# Patient Record
Sex: Male | Born: 1995 | Race: Black or African American | Hispanic: No | Marital: Single | Smoking: Current every day smoker
Health system: Southern US, Community
[De-identification: ages and names within clinical notes are randomized; demographics above are authoritative.]

## PROBLEM LIST (undated history)

## (undated) HISTORY — PX: FRACTURE SURGERY: SHX138

---
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
## 2016-08-19 ENCOUNTER — Encounter (HOSPITAL_COMMUNITY): Payer: Self-pay

## 2016-08-19 ENCOUNTER — Ambulatory Visit (HOSPITAL_COMMUNITY)
Admission: EM | Admit: 2016-08-19 | Discharge: 2016-08-19 | Disposition: A | Discharge status: Nursing Home (Includes ICF) | Payer: Medicaid Other

## 2016-08-19 ENCOUNTER — Emergency Department (HOSPITAL_COMMUNITY)
Admission: EM | Admit: 2016-08-19 | Discharge: 2016-08-20 | Disposition: A | Discharge status: Home / Self Care | Payer: Medicaid Other | Attending: Emergency Medicine | Admitting: Emergency Medicine

## 2016-08-19 ENCOUNTER — Encounter (HOSPITAL_COMMUNITY): Payer: Self-pay | Admitting: Emergency Medicine

## 2016-08-19 ENCOUNTER — Emergency Department (HOSPITAL_COMMUNITY): Payer: Medicaid Other

## 2016-08-19 DIAGNOSIS — R079 Chest pain, unspecified: Secondary | ICD-10-CM

## 2016-08-19 DIAGNOSIS — R0789 Other chest pain: Secondary | ICD-10-CM | POA: Diagnosis not present

## 2016-08-19 DIAGNOSIS — I498 Other specified cardiac arrhythmias: Secondary | ICD-10-CM

## 2016-08-19 DIAGNOSIS — I499 Cardiac arrhythmia, unspecified: Secondary | ICD-10-CM

## 2016-08-19 DIAGNOSIS — R008 Other abnormalities of heart beat: Secondary | ICD-10-CM | POA: Diagnosis not present

## 2016-08-19 LAB — CBC
HCT: 42.7 % (ref 39.0–52.0)
Hemoglobin: 14.9 g/dL (ref 13.0–17.0)
MCH: 29.1 pg (ref 26.0–34.0)
MCHC: 34.9 g/dL (ref 30.0–36.0)
MCV: 83.4 fL (ref 78.0–100.0)
PLATELETS: 229 10*3/uL (ref 150–400)
RBC: 5.12 MIL/uL (ref 4.22–5.81)
RDW: 12.3 % (ref 11.5–15.5)
WBC: 9.5 10*3/uL (ref 4.0–10.5)

## 2016-08-19 LAB — BASIC METABOLIC PANEL
Anion gap: 10 (ref 5–15)
BUN: 7 mg/dL (ref 6–20)
CHLORIDE: 97 mmol/L — AB (ref 101–111)
CO2: 24 mmol/L (ref 22–32)
CREATININE: 1.01 mg/dL (ref 0.61–1.24)
Calcium: 9.6 mg/dL (ref 8.9–10.3)
Glucose, Bld: 105 mg/dL — ABNORMAL HIGH (ref 65–99)
POTASSIUM: 3.3 mmol/L — AB (ref 3.5–5.1)
SODIUM: 131 mmol/L — AB (ref 135–145)

## 2016-08-19 LAB — I-STAT TROPONIN, ED: TROPONIN I, POC: 0 ng/mL (ref 0.00–0.08)

## 2016-08-19 MED ORDER — POTASSIUM CHLORIDE CRYS ER 20 MEQ PO TBCR
40.0000 meq | EXTENDED_RELEASE_TABLET | Freq: Once | ORAL | Status: AC
  Administered 2016-08-19: 40 meq via ORAL
  Filled 2016-08-19: qty 2

## 2016-08-19 NOTE — ED Notes (Signed)
[facility]

## 2016-08-19 NOTE — Discharge Instructions (Signed)
Follow up with local heart doctor for further evaluation of your symptoms.  If you were given medicines take as directed.  If you are on coumadin or contraceptives realize their levels and effectiveness is altered by many different medicines.  If you have any reaction (rash, tongues swelling, other) to the medicines stop taking and see a physician.    If your blood pressure was elevated in the ER make sure you follow up for management with a primary doctor or return for chest pain, shortness of breath or stroke symptoms.  Please follow up as directed and return to the ER or see a physician for new or worsening symptoms.  Thank you. Vitals:   08/19/16 2143 08/19/16 2155  BP: 153/77 138/80  Pulse: 100 101  Resp: 16 20  Temp: 98.1 F (36.7 C)   TempSrc: Oral   SpO2: 100% 100%

## 2016-08-19 NOTE — ED Notes (Signed)
Patient transported to X-ray 

## 2016-08-19 NOTE — ED Triage Notes (Signed)
[facility] a [facility] of left sided chest pain x 2 months. The patient stated that the pain feels like a pressure and tightness that on occasion radiates into his left arm. The patient stated that when he takes a deep breath it does feel better. The patient stated that he has never been diagnosed with anxiety but feels that he has it.

## 2016-08-19 NOTE — ED Triage Notes (Signed)
Pt sent to ED from [facility] for chest pain. EKG show vent bigeminy.  Onset several months left sided chest pain. Worse with exercising and better with taking deep breaths.  No other s/s noted.

## 2016-08-19 NOTE — ED Provider Notes (Signed)
CSN: [id number]     Arrival date & time 08/19/16  1912 History   First MD Initiated Contact with Patient 08/19/16 2115     Chief Complaint  Patient presents with  . Chest Pain   (Consider location/radiation/quality/duration/timing/severity/associated sxs/prior Treatment) Patient denies excessive caffeine intake, denies cocaine use. Admits to feeling anxious and have anxiety at time, also endorses smoking mariajuana.    The history is provided by the patient.  Chest Pain  Pain location:  L chest Pain quality comment:  Unable to specify Pain radiates to:  Neck Pain severity now: "earlier was really bad; the worst it ever felt" Duration: 2 months. Timing: Was intermittent but become more constant. Chronicity:  New Relieved by:  Nothing Exacerbated by: exercising would sometimes aggrevate it. Ineffective treatments:  Certain positions (certain positions sometimes help) Associated symptoms: anxiety, back pain and palpitations   Associated symptoms: no cough, no diaphoresis, no dizziness, no fatigue, no fever, no headache, no nausea, no shortness of breath, no syncope and no weakness     History reviewed. No pertinent past medical history. Past Surgical History:  Procedure Laterality Date  . FRACTURE SURGERY     Family History  Problem Relation Age of Onset  . Hypertension Father   . Diabetes Paternal Grandmother   . Hypertension Paternal Grandmother    Social History  Substance Use Topics  . Smoking status: Never Smoker  . Smokeless tobacco: Never Used  . Alcohol use No    Review of Systems  Constitutional: Negative for chills, diaphoresis, fatigue and fever.  Respiratory: Negative for cough and shortness of breath.   Cardiovascular: Positive for chest pain and palpitations. Negative for syncope.  Gastrointestinal: Negative for nausea.  Musculoskeletal: Positive for back pain.  Neurological: Negative for dizziness, weakness and headaches.  Psychiatric/Behavioral:   Positive for anxiety    Allergies  Review of patient's allergies indicates no known allergies.  Home Medications   Prior to Admission medications   Medication Sig Start Date End Date Taking? Authorizing Provider  ibuprofen (ADVIL,MOTRIN) 800 MG tablet Take 800 mg by mouth every 8 (eight) hours as needed.   Yes Historical Provider, MD   Meds Ordered and Administered this Visit  Medications - No data to display  BP 122/80 (BP Location: Left Arm)   Pulse 71   Temp 98.2 F (36.8 C) (Oral)   Resp 16   Ht 5\' 10"  (1.778 m)   Wt 153 lb (69.4 kg)   SpO2 97%   BMI 21.95 kg/m  No data found.   Physical Exam  Constitutional: He is oriented to person, place, and time. He appears well-developed and well-nourished. No distress.  HENT:  Head: Normocephalic and atraumatic.  Eyes: Pupils are equal, round, and reactive to light.  Neck: Normal range of motion. Neck supple.  Cardiovascular: Normal rate, regular rhythm and normal heart sounds.   No murmur heard. Pulmonary/Chest: Effort normal and breath sounds normal. He has no wheezes.  Abdominal: Soft. Bowel sounds are normal. There is no tenderness.  Musculoskeletal: Normal range of motion. He exhibits no edema.  Neurological: He is alert and oriented to person, place, and time. Coordination normal.  Skin: Skin is warm and dry. No rash noted. No pallor.  Psychiatric: He has a normal mood and affect. His behavior is normal.  Nursing note and vitals reviewed.   Urgent Care Course   Clinical Course    Procedures (including critical care time)  Labs Review Labs Reviewed - No data to display  Imaging Review No results found.      MDM   1. Ventricular bigeminy   2. Chest pain, unspecified chest pain type     Physical examination was normal with no red flags noted. EKG has bigeminy. Bigeminy could be due to his anxiety but other causes cannot be rule out. Patient requires further workup that we do not offer here. Despite his  normal physical exam,  I do not feel comfortable sending patient home tonight given his abnormal EKG. Patient transferred to ER via his own-vehicle for further evaluation. Patient is currently stable, vital sign appropriate, he is safe to transport via own vehicle. All questions were answered.    Lucia EstelleFeng Shreyansh Tiffany, NP 08/19/16 2231

## 2016-08-19 NOTE — ED Provider Notes (Signed)
[facility]ENCY DEPT Provider Note   CSN: [id number] Arrival date & time: 08/19/16  2133     History   Chief Complaint Chief Complaint  Patient presents with  . Chest Pain    HPI Jose Waters is a 20 y.o. male.  Patient presents with recurrent chest discomfort. Nonspecific ache lasting various amounts of time for the past 2 months. No significant family or personal history of cardiac issues. Patient exercises without difficulty has never passed out. No focal radiation. No recent surgery no leg swelling. Patient feels well otherwise      History reviewed. No pertinent past medical history.  There are no active problems to display for this patient.   Past Surgical History:  Procedure Laterality Date  . FRACTURE SURGERY         Home Medications    Prior to Admission medications   Medication Sig Start Date End Date Taking? Authorizing Provider  ibuprofen (ADVIL,MOTRIN) 800 MG tablet Take 800 mg by mouth every 8 (eight) hours as needed (for back pain).    Yes Historical Provider, MD    Family History Family History  Problem Relation Age of Onset  . Hypertension Father   . Diabetes Paternal Grandmother   . Hypertension Paternal Grandmother     Social History Social History  Substance Use Topics  . Smoking status: Never Smoker  . Smokeless tobacco: Never Used  . Alcohol use No     Allergies   Review of patient's allergies indicates no known allergies.   Review of Systems Review of Systems  Constitutional: Negative for chills and fever.  HENT: Negative for congestion.   Eyes: Negative for visual disturbance.  Respiratory: Negative for shortness of breath.   Cardiovascular: Positive for chest pain.  Gastrointestinal: Negative for abdominal pain and vomiting.  Genitourinary: Negative for dysuria and flank pain.  Musculoskeletal: Negative for back pain, neck pain and neck stiffness.  Skin: Negative for rash.  Neurological: Negative for syncope,  light-headedness and headaches.     Physical Exam Updated Vital Signs BP 138/80   Pulse 101   Temp 98.1 F (36.7 C) (Oral)   Resp 20   SpO2 100%   Physical Exam  Constitutional: He is oriented to person, place, and time. He appears well-developed and well-nourished.  HENT:  Head: Normocephalic and atraumatic.  Eyes: Conjunctivae are normal. Right eye exhibits no discharge. Left eye exhibits no discharge.  Neck: Normal range of motion. Neck supple. No tracheal deviation present.  Cardiovascular: Normal rate, regular rhythm, normal heart sounds and intact distal pulses.   No murmur heard. Pulmonary/Chest: Effort normal and breath sounds normal.  Abdominal: Soft. He exhibits no distension. There is no tenderness. There is no guarding.  Musculoskeletal: He exhibits no edema.  Neurological: He is alert and oriented to person, place, and time.  Skin: Skin is warm. No rash noted.  Psychiatric: He has a normal mood and affect.  Nursing note and vitals reviewed.    ED Treatments / Results  Labs (all labs ordered are listed, but only abnormal results are displayed) Labs Reviewed  BASIC METABOLIC PANEL - Abnormal; Notable for the following:       Result Value   Sodium 131 (*)    Potassium 3.3 (*)    Chloride 97 (*)    Glucose, Bld 105 (*)    All other components within normal limits  CBC  I-STAT TROPOININ, ED    EKG  EKG Interpretation None      EKG reviewed  heart rate 103, sinus tachycardia with frequent PVCs/bigeminy, minimally prolonged QT Radiology No results found.  Procedures Procedures (including critical care time)  Medications Ordered in ED Medications  potassium chloride SA (K-DUR,KLOR-CON) CR tablet 40 mEq (40 mEq Oral Given 08/19/16 2310)     Initial Impression / Assessment and Plan / ED Course  I have reviewed the triage vital signs and the nursing notes.  Pertinent labs & imaging results that were available during my care of the patient were  reviewed by me and considered in my medical decision making (see chart for details).  Clinical Course   Well-appearing patient presents with intermittent chest pain 2 months. Patient is bigeminy and frequent PVCs in the ER. No syncope or red flags on history. Discussed the case with cardiology fellow who recommended outpatient follow up in the clinic in the next week. Discussed with patient who agrees with the plan  Results and differential diagnosis were discussed with the patient/parent/guardian. Xrays were independently reviewed by myself.  Close follow up outpatient was discussed, comfortable with the plan.   Medications  potassium chloride SA (K-DUR,KLOR-CON) CR tablet 40 mEq (40 mEq Oral Given 08/19/16 2310)    Vitals:   08/19/16 2143 08/19/16 2155 08/19/16 2300 08/19/16 2349  BP: 153/77 138/80 124/79 136/98  Pulse: 100 101 93 80  Resp: 16 20 16 23   Temp: 98.1 F (36.7 C)     TempSrc: Oral     SpO2: 100% 100% 99% 98%    Final diagnoses:  Bigeminy  Chest pain, unspecified     Final Clinical Impressions(s) / ED Diagnoses   Final diagnoses:  Bigeminy  Chest pain, unspecified    New Prescriptions New Prescriptions   No medications on file     Blane OharaJoshua Jakya Dovidio, MD 08/19/16 2357

## 2016-08-20 NOTE — ED Notes (Signed)
Pt understood dc material. NAD noted. 

## 2016-08-23 ENCOUNTER — Emergency Department (HOSPITAL_COMMUNITY)
Admission: EM | Admit: 2016-08-23 | Discharge: 2016-08-24 | Disposition: A | Discharge status: Home / Self Care | Payer: Medicaid Other | Attending: Emergency Medicine | Admitting: Emergency Medicine

## 2016-08-23 ENCOUNTER — Encounter (HOSPITAL_COMMUNITY): Payer: Self-pay

## 2016-08-23 DIAGNOSIS — R1031 Right lower quadrant pain: Secondary | ICD-10-CM

## 2016-08-23 DIAGNOSIS — Z791 Long term (current) use of non-steroidal anti-inflammatories (NSAID): Secondary | ICD-10-CM | POA: Insufficient documentation

## 2016-08-23 LAB — COMPREHENSIVE METABOLIC PANEL
ALBUMIN: 5 g/dL (ref 3.5–5.0)
ALT: 16 U/L — ABNORMAL LOW (ref 17–63)
ANION GAP: 7 (ref 5–15)
AST: 20 U/L (ref 15–41)
Alkaline Phosphatase: 85 U/L (ref 38–126)
BILIRUBIN TOTAL: 0.9 mg/dL (ref 0.3–1.2)
BUN: 20 mg/dL (ref 6–20)
CO2: 29 mmol/L (ref 22–32)
Calcium: 9.4 mg/dL (ref 8.9–10.3)
Chloride: 104 mmol/L (ref 101–111)
Creatinine, Ser: 0.98 mg/dL (ref 0.61–1.24)
GFR calc Af Amer: >60 mL/min (ref >60)
GFR calc non Af Amer: >60 mL/min (ref >60)
GLUCOSE: 113 mg/dL — AB (ref 65–99)
POTASSIUM: 3.6 mmol/L (ref 3.5–5.1)
SODIUM: 140 mmol/L (ref 135–145)
TOTAL PROTEIN: 7.3 g/dL (ref 6.5–8.1)

## 2016-08-23 LAB — CBC
HEMATOCRIT: 38.4 % — AB (ref 39.0–52.0)
HEMOGLOBIN: 13.6 g/dL (ref 13.0–17.0)
MCH: 28.9 pg (ref 26.0–34.0)
MCHC: 35.4 g/dL (ref 30.0–36.0)
MCV: 81.5 fL (ref 78.0–100.0)
Platelets: 233 10*3/uL (ref 150–400)
RBC: 4.71 MIL/uL (ref 4.22–5.81)
RDW: 12.5 % (ref 11.5–15.5)
WBC: 6.1 10*3/uL (ref 4.0–10.5)

## 2016-08-23 LAB — LIPASE, BLOOD: Lipase: 25 U/L (ref 11–51)

## 2016-08-23 MED ORDER — MORPHINE SULFATE (PF) 4 MG/ML IV SOLN
4.0000 mg | Freq: Once | INTRAVENOUS | Status: AC
  Administered 2016-08-24: 4 mg via INTRAVENOUS
  Filled 2016-08-23: qty 1

## 2016-08-23 MED ORDER — ONDANSETRON HCL 4 MG/2ML IJ SOLN
4.0000 mg | Freq: Once | INTRAMUSCULAR | Status: AC
  Administered 2016-08-24: 4 mg via INTRAVENOUS
  Filled 2016-08-23: qty 2

## 2016-08-23 MED ORDER — SODIUM CHLORIDE 0.9 % IV BOLUS (SEPSIS)
1000.0000 mL | Freq: Once | INTRAVENOUS | Status: AC
  Administered 2016-08-23: 1000 mL via INTRAVENOUS

## 2016-08-23 NOTE — ED Provider Notes (Signed)
[facility]EMERGENCY DEPT Provider Note   CSN: [id number] Arrival date & time: 08/23/16  2158  By signing my name below, I, Jose Waters, attest that this documentation has been prepared under the direction and in the presence of non-physician practitioner, Arthor CaptainAbigail Arien Benincasa, PA-C. Electronically Signed: Majel HomerPeyton Waters, Scribe. 08/23/2016. 11:37 PM.  History   Chief Complaint Chief Complaint  Patient presents with  . Abdominal Pain    RLQ   The history is provided by the patient. No language interpreter was used.   HPI Comments: Jose Waters is a 20 y.o. male who presents to the Emergency Department complaining of gradually worsening, constant, "tight," 7/10, RLQ abdominal pain that began 3 days ago and worsened today. Pt reports his pain is exacerbated after eating and when exhaling deeply. He notes this is the first time he has experienced similar symptoms. He notes his last meal was at ~6:00 PM this evening. Pt reports his last BM was yesterday. He denies nausea, vomiting, diarrhea, difficulty urinating,  frequency, urgency, hematuria and PSHx ot his abdomen.   [facility] ED [facility]/27 and diagnosed with ventricular bigeminy.   History reviewed. No pertinent past medical history.  There are no active problems to display for this patient.  Past Surgical History:  Procedure Laterality Date  . FRACTURE SURGERY      Home Medications    Prior to Admission medications   Medication Sig Start Date End Date Taking? Authorizing Provider  ibuprofen (ADVIL,MOTRIN) 800 MG tablet Take 800 mg by mouth every 8 (eight) hours as needed (for back pain).    Yes Historical Provider, MD  naproxen (NAPROSYN) 500 MG tablet Take 500 mg by mouth 2 (two) times daily with a meal.   Yes Historical Provider, MD   Family History Family History  Problem Relation Age of Onset  . Hypertension Father   . Diabetes Paternal Grandmother   . Hypertension Paternal Grandmother     Social History Social History    Substance Use Topics  . Smoking status: Never Smoker  . Smokeless tobacco: Never Used  . Alcohol use No   Allergies   Review of patient's allergies indicates no known allergies.   Review of Systems Review of Systems 10 systems reviewed and all are negative for acute change except as noted in the HPI.  Physical Exam Updated Vital Signs BP 153/86 (BP Location: Right Arm)   Pulse 87   Resp 18   SpO2 100%   Physical Exam  Constitutional: He is oriented to person, place, and time. He appears well-developed and well-nourished.  HENT:  Head: Normocephalic and atraumatic.  Eyes: EOM are normal.  Neck: Normal range of motion.  Cardiovascular: Normal rate, regular rhythm, normal heart sounds and intact distal pulses.   Pulmonary/Chest: Effort normal and breath sounds normal. No respiratory distress.  Abdominal: Soft. He exhibits no distension. There is tenderness.  Positive at McBurney's point   Musculoskeletal: Normal range of motion.  Neurological: He is alert and oriented to person, place, and time.  Skin: Skin is warm and dry.  Psychiatric: He has a normal mood and affect. Judgment normal.  Nursing note and vitals reviewed.  ED Treatments / Results  Labs (all labs ordered are listed, but only abnormal results are displayed) Labs Reviewed  LIPASE, BLOOD  COMPREHENSIVE METABOLIC PANEL  CBC  URINALYSIS, ROUTINE W REFLEX MICROSCOPIC (NOT AT [facility])    EKG  EKG Interpretation None       Radiology No results found.  Procedures Procedures  DIAGNOSTIC STUDIES:  Oxygen Saturation is 98% on RA, normal by my interpretation.    COORDINATION OF CARE:  11:35 PM Discussed treatment plan with pt at bedside and pt agreed to plan.  Medications Ordered in ED Medications - No data to display   Initial Impression / Assessment and Plan / ED Course  I have reviewed the triage vital signs and the nursing notes.  Pertinent labs & imaging results that were available during my  care of the patient were reviewed by me and considered in my medical decision making (see chart for details).  Clinical Course    Patient is nontoxic, nonseptic appearing, in no apparent distress.  Patient's pain and other symptoms adequately managed in emergency department.  Fluid bolus given.  Labs, imaging and vitals reviewed.  Patient does not meet the SIRS or Sepsis criteria.  On repeat exam patient does not have a surgical abdomin and there are no peritoneal signs.  No indication of appendicitis, bowel obstruction, bowel perforation, cholecystitis, diverticulitis.  Patient discharged home with symptomatic treatment and given strict instructions for follow-up with their primary care physician.  I have also discussed reasons to return immediately to the ER.  Patient expresses understanding and agrees with plan.     I personally performed the services described in this documentation, which was scribed in my presence. The recorded information has been reviewed and is accurate.   Final Clinical Impressions(s) / ED Diagnoses   Final diagnoses:  None    New Prescriptions New Prescriptions   No medications on file     Arthor Captain, PA-C 08/24/16 0118    Paula Libra, MD 08/24/16 1610

## 2016-08-23 NOTE — ED Triage Notes (Signed)
Pt presents with RLQ pain for the last 3 days, but it got worse today. Pt is tearful in triage. Denies N/V/D. Denies rebound tenderness. A&Ox4. Ambulatory.

## 2016-08-24 ENCOUNTER — Encounter (HOSPITAL_COMMUNITY): Payer: Self-pay

## 2016-08-24 ENCOUNTER — Emergency Department (HOSPITAL_COMMUNITY): Payer: Medicaid Other

## 2016-08-24 LAB — RPR: RPR Ser Ql: NONREACTIVE

## 2016-08-24 LAB — HIV ANTIBODY (ROUTINE TESTING W REFLEX): HIV SCREEN 4TH GENERATION: NONREACTIVE

## 2016-08-24 MED ORDER — DICYCLOMINE HCL 20 MG PO TABS: 20.0000 mg | ORAL_TABLET | Freq: Two times a day (BID) | ORAL | 0 refills | Status: DC

## 2016-08-24 MED ORDER — NAPROXEN 375 MG PO TABS: 375.0000 mg | ORAL_TABLET | Freq: Two times a day (BID) | ORAL | 0 refills | Status: DC

## 2016-08-24 MED ORDER — IOPAMIDOL (ISOVUE-300) INJECTION 61%
100.0000 mL | Freq: Once | INTRAVENOUS | Status: AC | PRN
  Administered 2016-08-24: 100 mL via INTRAVENOUS

## 2016-08-24 NOTE — Discharge Instructions (Signed)
Your CT scan was negative and your EKG showed no abnormalities. There is no evidence of infection in her abdomen. No evidence of kidney stones or other abnormalities. Unsure of the reason for abdominal pain. Sometimes gas pain or constipation can cause severe pain.

## 2016-08-31 ENCOUNTER — Encounter (HOSPITAL_COMMUNITY): Payer: Self-pay

## 2016-08-31 ENCOUNTER — Emergency Department (HOSPITAL_COMMUNITY)
Admission: EM | Admit: 2016-08-31 | Discharge: 2016-09-01 | Disposition: A | Discharge status: Home / Self Care | Payer: Medicaid Other | Attending: Emergency Medicine | Admitting: Emergency Medicine

## 2016-08-31 DIAGNOSIS — I493 Ventricular premature depolarization: Secondary | ICD-10-CM | POA: Diagnosis not present

## 2016-08-31 DIAGNOSIS — R251 Tremor, unspecified: Secondary | ICD-10-CM | POA: Diagnosis not present

## 2016-08-31 DIAGNOSIS — R0789 Other chest pain: Secondary | ICD-10-CM | POA: Diagnosis present

## 2016-08-31 DIAGNOSIS — I498 Other specified cardiac arrhythmias: Secondary | ICD-10-CM

## 2016-08-31 DIAGNOSIS — I499 Cardiac arrhythmia, unspecified: Secondary | ICD-10-CM

## 2016-08-31 NOTE — ED Provider Notes (Signed)
[facility] Provider Note   CSN: [id number] Arrival date & time: 08/31/16  2309 By signing my name below, I, Bridgette Habermann, attest that this documentation has been prepared under the direction and in the presence of Gilda Crease, MD. Electronically Signed: Bridgette Habermann, ED Scribe. 08/31/16. 11:48 PM.  History   Chief Complaint Chief Complaint  Patient presents with  . Tachycardia   HPI Comments: Jose Waters is a 20 y.o. male who presents to the Emergency Department complaining of constant chest tightness onset just PTA. Pt states he also has mild shortness of breath, heart palpitations, and tremors. Denies modifying factors. Pt states his chest tightness and palpitations are resolved at this time but notes that tremors are still present in his bilateral lower extremities. Pt denies chest pain, fever, or any other associated symptoms.  The history is provided by the patient. No language interpreter was used.    History reviewed. No pertinent past medical history.  There are no active problems to display for this patient.   Past Surgical History:  Procedure Laterality Date  . FRACTURE SURGERY         Home Medications    Prior to Admission medications   Medication Sig Start Date End Date Taking? Authorizing Provider  dicyclomine (BENTYL) 20 MG tablet Take 1 tablet (20 mg total) by mouth 2 (two) times daily. 08/24/16  Yes Arthor Captain, PA-C  naproxen (NAPROSYN) 375 MG tablet Take 1 tablet (375 mg total) by mouth 2 (two) times daily. Patient not taking: Reported on 09/01/2016 08/24/16   Arthor Captain, PA-C    Family History Family History  Problem Relation Age of Onset  . Hypertension Father   . Diabetes Paternal Grandmother   . Hypertension Paternal Grandmother     Social History Social History  Substance Use Topics  . Smoking status: Never Smoker  . Smokeless tobacco: Never Used  . Alcohol use No     Allergies   Review of patient's allergies indicates  no known allergies.   Review of Systems Review of Systems  Constitutional: Negative for fever.  Respiratory: Positive for chest tightness and shortness of breath.   Cardiovascular: Positive for palpitations. Negative for chest pain.  Neurological: Positive for tremors.  All other systems reviewed and are negative.    Physical Exam Updated Vital Signs BP 131/77 (BP Location: Left Arm)   Pulse 63   Temp 98.8 F (37.1 C) (Oral)   Resp 21   SpO2 99%   Physical Exam  Constitutional: He is oriented to person, place, and time. He appears well-developed and well-nourished. No distress.  Very anxious appearing.  HENT:  Head: Normocephalic and atraumatic.  Right Ear: Hearing normal.  Left Ear: Hearing normal.  Nose: Nose normal.  Mouth/Throat: Oropharynx is clear and moist and mucous membranes are normal.  Eyes: Conjunctivae and EOM are normal. Pupils are equal, round, and reactive to light.  Neck: Normal range of motion. Neck supple.  Cardiovascular: Regular rhythm, S1 normal and S2 normal.  Exam reveals no gallop and no friction rub.   No murmur heard. Occasional extra systole.  Pulmonary/Chest: Effort normal and breath sounds normal. No respiratory distress. He exhibits no tenderness.  Abdominal: Soft. Normal appearance and bowel sounds are normal. There is no hepatosplenomegaly. There is no tenderness. There is no rebound, no guarding, no tenderness at McBurney's point and negative Murphy's sign. No hernia.  Musculoskeletal: Normal range of motion.  Neurological: He is alert and oriented to person, place, and time.  He has normal strength. No cranial nerve deficit or sensory deficit. Coordination normal. GCS eye subscore is 4. GCS verbal subscore is 5. GCS motor subscore is 6.  Skin: Skin is warm, dry and intact. No rash noted. No cyanosis.  Psychiatric: He has a normal mood and affect. His speech is normal and behavior is normal. Thought content normal.  Nursing note and vitals  reviewed.    ED Treatments / Results  DIAGNOSTIC STUDIES: Oxygen Saturation is 100% on RA, normal by my interpretation.    COORDINATION OF CARE: 11:46 PM Discussed treatment plan with pt at bedside which includes cardiac monitoring and pt agreed to plan.  Labs (all labs ordered are listed, but only abnormal results are displayed) Labs Reviewed  I-STAT CHEM 8, ED - Abnormal; Notable for the following:       Result Value   Chloride 98 (*)    All other components within normal limits    EKG  EKG Interpretation  Date/Time:  Friday August 31 2016 23:18:06 EDT Ventricular Rate:  97 PR Interval:    QRS Duration: 103 QT Interval:  371 QTC Calculation: 472 R Axis:   81 Text Interpretation:  Sinus tachycardia Ventricular bigeminy RAE, consider biatrial enlargement Borderline T wave abnormalities Borderline prolonged QT interval No significant change since last tracing Confirmed by Christe Tellez  MD, Codi Kertz [phone]([id number]) on 08/31/2016 11:39:37 PM       Radiology No results found.  Procedures Procedures (including critical care time)  Medications Ordered in ED Medications - No data to display   Initial Impression / Assessment and Plan / ED Course  I have reviewed the triage vital signs and the nursing notes.  Pertinent labs & imaging results that were available during my care of the patient were reviewed by me and considered in my medical decision making (see chart for details).  Clinical Course    Patient presents to the emergency department for evaluation ofpalpitations. Patient has been seen in the ER with this previously. At each visit he has been documented to have PVCs and occasional ventricular bigeminy, but no othermalignant arrhythmia. Patient has once again been seen to have ventricular bigeminy here in the ER, has been monitored for a period of time without any other arrhythmia. He did have mild hypokalemia at previous visit, normal today. Patient is extremely anxious.He  was reassured, and follow-up with cardiology in the office for outpatient monitoring. Will start low dose lopressor.  Final Clinical Impressions(s) / ED Diagnoses   Final diagnoses:  Ventricular bigeminy    New Prescriptions New Prescriptions   No medications on file  I personally performed the services described in this documentation, which was scribed in my presence. The recorded information has been reviewed and is accurate.      Gilda Creasehristopher J Aileana Hodder, MD 09/01/16 [phone]

## 2016-08-31 NOTE — ED Triage Notes (Signed)
Patient c/o chest tightness and heart beating fast.  Patient denies N/V, states has "a little" SOB.  Patient states that nothing makes is worse or better.  Patient breathing even and unlabored.  Denies chest pain at this time.

## 2016-09-01 LAB — I-STAT CHEM 8, ED
BUN: 12 mg/dL (ref 6–20)
CALCIUM ION: 1.2 mmol/L (ref 1.15–1.40)
CHLORIDE: 98 mmol/L — AB (ref 101–111)
Creatinine, Ser: 0.9 mg/dL (ref 0.61–1.24)
GLUCOSE: 83 mg/dL (ref 65–99)
HCT: 42 % (ref 39.0–52.0)
HEMOGLOBIN: 14.3 g/dL (ref 13.0–17.0)
POTASSIUM: 3.6 mmol/L (ref 3.5–5.1)
SODIUM: 139 mmol/L (ref 135–145)
TCO2: 28 mmol/L (ref 0–100)

## 2016-09-01 MED ORDER — METOPROLOL TARTRATE 25 MG PO TABS: 12.5000 mg | ORAL_TABLET | Freq: Two times a day (BID) | ORAL | 0 refills | Status: DC

## 2016-09-05 ENCOUNTER — Ambulatory Visit (INDEPENDENT_AMBULATORY_CARE_PROVIDER_SITE_OTHER): Payer: Medicaid Other | Admitting: Internal Medicine

## 2016-09-05 ENCOUNTER — Encounter: Payer: Self-pay | Admitting: Internal Medicine

## 2016-09-05 VITALS — BP 124/78 | HR 84 | Ht 69.0 in | Wt 146.1 lb

## 2016-09-05 DIAGNOSIS — I493 Ventricular premature depolarization: Secondary | ICD-10-CM | POA: Diagnosis not present

## 2016-09-05 DIAGNOSIS — R079 Chest pain, unspecified: Secondary | ICD-10-CM | POA: Diagnosis not present

## 2016-09-05 MED ORDER — IBUPROFEN 200 MG PO TABS: 400.0000 mg | ORAL_TABLET | Freq: Four times a day (QID) | ORAL | 0 refills | Status: DC | PRN

## 2016-09-05 MED ORDER — RANITIDINE HCL 150 MG PO TABS: 150.0000 mg | ORAL_TABLET | Freq: Two times a day (BID) | ORAL | Status: DC

## 2016-09-05 NOTE — Progress Notes (Signed)
New Outpatient Visit Date: 09/05/2016  Referring Provider: Blane Ohara, MD [address] [place], [place] [address]  Chief Complaint:  Chief Complaint  Patient presents with  . Chest Pain  . Palpitations    HPI:  Jose Waters is a 20 y.o. year-old male with no significant past medical, who has been referred by Dr. Jodi Mourning in the [facility] ED for evaluation of chest pain and palpitations.  The patient reports intermittent chest pain described as pressure and tightness in the center of the chest for 1 month.  The pain is not exertional or affected by deep inspiration but seems to worsen when lying or sitting in certain positions.  He also notes occasional sharp pain under his left breast which moves up to his collar bone.  The pain typically lasts ~30 minutes before resolving on its own.  On average, the patient experiences the pain every other day.  At it's worst, the pain in 7-8/10.  He experienced 2/10 pain upon arrival in clinic today.  The patient endorses mild accompanying shortness of breath but denies nausea, vomiting, diaphoresis, and dizziness.  He believes the symptoms began about 1-2 months after he began smoking marijuana.  He has not smoked since the symptoms began.  He has used naproxen intermittently with resolution of the pain.  However, he stopped using this due to GI upset.  The patient denies chest wall trauma or tenderness to palpation.  The patient also notes occasional palpitations described as racing and skipped beats in his chest.  During his most recent ED visit, he was noted to have frequent PVC's and was started on metoprolol.  He believes the chest pain and palpitations have improved somewhat since starting this medication.  He rarely consumes energy drinks and has not had one since his chest pain and palpitations began.  --------------------------------------------------------------------------------------------------  Past Medical History: None  Past Surgical History:   Procedure Laterality Date  . FRACTURE SURGERY     Right knuckle surgery    Outpatient Encounter Prescriptions as of 09/05/2016  Medication Sig  . dicyclomine (BENTYL) 20 MG tablet Take 1 tablet (20 mg total) by mouth 2 (two) times daily.  . metoprolol tartrate (LOPRESSOR) 25 MG tablet Take 0.5 tablets (12.5 mg total) by mouth 2 (two) times daily.  . polyethylene glycol powder (GLYCOLAX/MIRALAX) powder Take 1 g by mouth daily.  Marland Kitchen ibuprofen (ADVIL,MOTRIN) 200 MG tablet Take 2 tablets (400 mg total) by mouth every 6 (six) hours as needed.  . ranitidine (ZANTAC) 150 MG tablet Take 1 tablet (150 mg total) by mouth 2 (two) times daily.  . [DISCONTINUED] naproxen (NAPROSYN) 375 MG tablet Take 1 tablet (375 mg total) by mouth 2 (two) times daily. (Patient not taking: Reported on 09/01/2016)   No facility-administered encounter medications on file as of 09/05/2016.     Allergies: Review of patient's allergies indicates no known allergies.  Social History   Social History  . Marital status: Single    Spouse name: N/A  . Number of children: N/A  . Years of education: N/A   Occupational History  . Not on file.   Social History Main Topics  . Smoking status: Never Smoker  . Smokeless tobacco: Never Used  . Alcohol use No  . Drug use:     Types: Marijuana  . Sexual activity: Not on file   Other Topics Concern  . Not on file   Social History Narrative  . No narrative on file    Family History  Problem Relation Age of Onset  . Hypertension Father   . Diabetes Paternal Grandmother   . Hypertension Paternal Grandmother   . Unexplained death Neg Hx   . Sudden Cardiac Death Neg Hx     Review of Systems: A 12-system review of systems was performed and was negative except as noted in the HPI.  --------------------------------------------------------------------------------------------------  Physical Exam: BP 124/78   Pulse 84   Ht 5\' 9"  (1.753 m)   Wt 146 lb 1.9 oz (66.3 kg)    SpO2 96%   BMI 21.58 kg/m   General:  Well-developed, well nourished young man, seated comfortably in the exam room.  He is accompanied by his girlfriend. HEENT: No conjunctival pallor or scleral icterus.  Moist mucous membranes.  OP clear. Neck: Supple without lymphadenopathy, thyromegaly, JVD, or HJR. Lungs: Normal work of breathing.  Clear to auscultation bilaterally without wheezes or crackles. CV: Regular rate and rhythm without murmurs, rubs, or gallops.  Non-displaced PMI.  Mild sternal tenderness to palpation. Abd: Bowel sounds present.  Soft, NT/ND without hepatosplenomegaly Ext: No lower extremity edema.  Radial, PT, and DP pulses are 2+ bilaterally Skin: warm and dry without rash Neuro: CNIII-XII intact.  Strength and fine-touch sensation intact in upper and lower extremities bilaterally. Psych: Normal mood and affect.  EKG:  Normal sinus rhythm without significant abnormalities.  Previously noted PVC's are no longer evident.  Lab Results  Component Value Date   WBC 6.1 08/23/2016   HGB 14.3 09/01/2016   HCT 42.0 09/01/2016   MCV 81.5 08/23/2016   PLT 233 08/23/2016    Lab Results  Component Value Date   NA 139 09/01/2016   K 3.6 09/01/2016   CL 98 (L) 09/01/2016   CO2 29 08/23/2016   BUN 12 09/01/2016   CREATININE 0.90 09/01/2016   GLUCOSE 83 09/01/2016   ALT 16 (L) 08/23/2016    --------------------------------------------------------------------------------------------------  ASSESSMENT AND PLAN: 1. Chest pain, unspecified chest pain type The patient has a 1 month history of atypical chest pain.  Most likely etiology is musculoskeletal, given improvement with NSAIDs.  EKG and physical exam are not suggestive of pericarditis.  Atherosclerotic CAD is very unlikely given his young age; myocardial ischemia would be a consideration if an anomalous coronary artery is present.  GI or pulmonary processes are also possible, particularly since symptoms began shortly  after the patient began smoking marijuana.  We will give with an echocardiogram to evaluate for structural abnormalities (especially in light of PVC's, as described below) and an exercise treadmill stress test.  I have also recommended that Jose Waters begin taking ranitidine 150 mg BID and ibuprofen 400 mg as needed for pain.  2. PVC's (premature ventricular contractions) Frequent PVC's noted during recent ED visit.  Symptomatically, palpitations have improved with metoprolol.  EKG today is without arrhtyhmia.  Will obtain TTE and ETT and continue with metoprolol.  Follow-up: Return to clinic in 3 months.  Yvonne Kendallhristopher Jameriah Trotti, MD 09/05/2016 8:57 PM

## 2016-09-05 NOTE — Patient Instructions (Signed)
Your physician has recommended you make the following change in your medication:  1.) start ranitidine (Zantac) 150 mg 1 tablet two times a day 2.) start ibuprofen 400 mg every 6 hours as needed for pain  Your physician has requested that you have an echocardiogram. Echocardiography is a painless test that uses sound waves to create images of your heart. It provides your doctor with information about the size and shape of your heart and how well your heart's chambers and valves are working. This procedure takes approximately one hour. There are no restrictions for this procedure.  Your physician has requested that you have an exercise tolerance test. For further information please visit [email or web address]. Please also follow instruction sheet, as given.  Your physician recommends that you schedule a follow-up appointment in: 3 months with Dr. Okey DupreEnd.

## 2016-09-06 NOTE — Addendum Note (Signed)
Addended by: Chana BodeGREEN, Dayvon Dax L on: 09/06/2016 04:22 PM   Modules accepted: Orders

## 2016-09-18 ENCOUNTER — Other Ambulatory Visit (HOSPITAL_COMMUNITY): Payer: Medicaid Other

## 2016-09-27 ENCOUNTER — Other Ambulatory Visit: Payer: Self-pay

## 2016-09-27 ENCOUNTER — Ambulatory Visit (INDEPENDENT_AMBULATORY_CARE_PROVIDER_SITE_OTHER): Payer: Medicaid Other

## 2016-09-27 ENCOUNTER — Ambulatory Visit (HOSPITAL_COMMUNITY): Payer: Medicaid Other | Attending: Cardiology

## 2016-09-27 DIAGNOSIS — I493 Ventricular premature depolarization: Secondary | ICD-10-CM | POA: Diagnosis not present

## 2016-09-27 DIAGNOSIS — R079 Chest pain, unspecified: Secondary | ICD-10-CM

## 2016-09-27 LAB — EXERCISE TOLERANCE TEST
CHL CUP MPHR: 201 {beats}/min
CHL RATE OF PERCEIVED EXERTION: 15
CSEPED: 12 min
CSEPHR: 94 %
Estimated workload: 13.4 METS
Exercise duration (sec): 0 s
Peak HR: 190 {beats}/min
Rest HR: 86 {beats}/min

## 2016-12-06 ENCOUNTER — Ambulatory Visit: Payer: Medicaid Other | Admitting: Internal Medicine

## 2016-12-06 NOTE — Progress Notes (Deleted)
Follow-up Outpatient Visit Date: 12/06/2016  Chief Complaint: ***  HPI:  Mr. Jose Waters is a 20 y.o. year-old male with history of ***, who presents for follow-up of ***.  --------------------------------------------------------------------------------------------------  Cardiovascular History & Procedures: Cardiovascular Problems:  ***  Risk Factors:  ***  Cath/PCI:  ***  CV Surgery:  ***  EP Procedures and Devices:  ***  Non-Invasive Evaluation(s):  ***  Recent CV Pertinent Labs: Lab Results  Component Value Date   K 3.6 09/01/2016   BUN 12 09/01/2016   CREATININE 0.90 09/01/2016     Past medical and surgical history were reviewed and updated in EPIC.   Outpatient Encounter Prescriptions as of 12/06/2016  Medication Sig  . dicyclomine (BENTYL) 20 MG tablet Take 1 tablet (20 mg total) by mouth 2 (two) times daily.  Marland Kitchen. ibuprofen (ADVIL,MOTRIN) 200 MG tablet Take 2 tablets (400 mg total) by mouth every 6 (six) hours as needed.  . metoprolol tartrate (LOPRESSOR) 25 MG tablet Take 0.5 tablets (12.5 mg total) by mouth 2 (two) times daily.  . polyethylene glycol powder (GLYCOLAX/MIRALAX) powder Take 1 g by mouth daily.  . ranitidine (ZANTAC) 150 MG tablet Take 1 tablet (150 mg total) by mouth 2 (two) times daily.   No facility-administered encounter medications on file as of 12/06/2016.     Allergies: Patient has no known allergies.  Social History   Social History  . Marital status: Single    Spouse name: N/A  . Number of children: N/A  . Years of education: N/A   Occupational History  . Not on file.   Social History Main Topics  . Smoking status: Never Smoker  . Smokeless tobacco: Never Used  . Alcohol use No  . Drug use:     Types: Marijuana  . Sexual activity: Not on file   Other Topics Concern  . Not on file   Social History Narrative  . No narrative on file    Family History  Problem Relation Age of Onset  . Hypertension Father     . Diabetes Paternal Grandmother   . Hypertension Paternal Grandmother   . Unexplained death Neg Hx   . Sudden Cardiac Death Neg Hx     Review of Systems: A 12-system review of systems was performed and was negative except as noted in the HPI.  --------------------------------------------------------------------------------------------------  Physical Exam: There were no vitals taken for this visit.  General:  *** HEENT: No conjunctival pallor or scleral icterus.  Moist mucous membranes.  OP clear. Neck: Supple without lymphadenopathy, thyromegaly, JVD, or HJR.  No carotid bruit. Lungs: Normal work of breathing.  Clear to auscultation bilaterally without wheezes or crackles. Heart: Regular rate and rhythm without murmurs, rubs, or gallops.  Non-displaced PMI. Abd: Bowel sounds present.  Soft, NT/ND without hepatosplenomegaly Ext: No lower extremity edema.  Radial, PT, and DP pulses are 2+ bilaterally. Skin: warm and dry without rash  EKG:  ***  Lab Results  Component Value Date   WBC 6.1 08/23/2016   HGB 14.3 09/01/2016   HCT 42.0 09/01/2016   MCV 81.5 08/23/2016   PLT 233 08/23/2016    Lab Results  Component Value Date   NA 139 09/01/2016   K 3.6 09/01/2016   CL 98 (L) 09/01/2016   CO2 29 08/23/2016   BUN 12 09/01/2016   CREATININE 0.90 09/01/2016   GLUCOSE 83 09/01/2016   ALT 16 (L) 08/23/2016    No results found for: CHOL, HDL, LDLCALC, LDLDIRECT, TRIG, CHOLHDL  --------------------------------------------------------------------------------------------------  ASSESSMENT AND PLAN: Yvonne Kendall***  Tiwanda Threats, MD 12/06/2016 7:58 AM

## 2016-12-18 ENCOUNTER — Encounter: Payer: Self-pay | Admitting: Internal Medicine

## 2018-05-08 ENCOUNTER — Other Ambulatory Visit: Payer: Self-pay

## 2018-05-08 ENCOUNTER — Encounter (HOSPITAL_COMMUNITY): Payer: Self-pay | Admitting: *Deleted

## 2018-05-08 ENCOUNTER — Emergency Department (HOSPITAL_COMMUNITY)
Admission: EM | Admit: 2018-05-08 | Discharge: 2018-05-08 | Disposition: A | Discharge status: Home / Self Care | Payer: Self-pay | Attending: Emergency Medicine | Admitting: Emergency Medicine

## 2018-05-08 DIAGNOSIS — Z202 Contact with and (suspected) exposure to infections with a predominantly sexual mode of transmission: Secondary | ICD-10-CM | POA: Insufficient documentation

## 2018-05-08 DIAGNOSIS — Z5321 Procedure and treatment not carried out due to patient leaving prior to being seen by health care provider: Secondary | ICD-10-CM | POA: Insufficient documentation

## 2018-05-08 NOTE — ED Triage Notes (Signed)
The pt wants to be  Checked out for gc he thinks he has been exposed  No symptoms

## 2018-07-30 ENCOUNTER — Emergency Department (HOSPITAL_COMMUNITY): Payer: Medicaid Other

## 2018-07-30 ENCOUNTER — Encounter (HOSPITAL_COMMUNITY): Payer: Self-pay | Admitting: Radiology

## 2018-07-30 ENCOUNTER — Emergency Department (HOSPITAL_COMMUNITY)
Admission: EM | Admit: 2018-07-30 | Discharge: 2018-07-30 | Disposition: A | Discharge status: Home / Self Care | Payer: Medicaid Other | Attending: Emergency Medicine | Admitting: Emergency Medicine

## 2018-07-30 DIAGNOSIS — Y929 Unspecified place or not applicable: Secondary | ICD-10-CM | POA: Insufficient documentation

## 2018-07-30 DIAGNOSIS — Y939 Activity, unspecified: Secondary | ICD-10-CM | POA: Insufficient documentation

## 2018-07-30 DIAGNOSIS — Y249XXA Unspecified firearm discharge, undetermined intent, initial encounter: Secondary | ICD-10-CM | POA: Insufficient documentation

## 2018-07-30 DIAGNOSIS — S31819A Unspecified open wound of right buttock, initial encounter: Secondary | ICD-10-CM | POA: Insufficient documentation

## 2018-07-30 DIAGNOSIS — Y999 Unspecified external cause status: Secondary | ICD-10-CM | POA: Diagnosis not present

## 2018-07-30 DIAGNOSIS — W3400XA Accidental discharge from unspecified firearms or gun, initial encounter: Secondary | ICD-10-CM

## 2018-07-30 LAB — I-STAT CHEM 8, ED
BUN: 8 mg/dL (ref 6–20)
CALCIUM ION: 1.15 mmol/L (ref 1.15–1.40)
Chloride: 100 mmol/L (ref 98–111)
Creatinine, Ser: 1 mg/dL (ref 0.61–1.24)
Glucose, Bld: 96 mg/dL (ref 70–99)
HCT: 42 % (ref 39.0–52.0)
Hemoglobin: 14.3 g/dL (ref 13.0–17.0)
Potassium: 3.3 mmol/L — ABNORMAL LOW (ref 3.5–5.1)
SODIUM: 140 mmol/L (ref 135–145)
TCO2: 25 mmol/L (ref 22–32)

## 2018-07-30 LAB — CBC
HEMATOCRIT: 42 % (ref 39.0–52.0)
HEMOGLOBIN: 13.5 g/dL (ref 13.0–17.0)
MCH: 28.9 pg (ref 26.0–34.0)
MCHC: 32.1 g/dL (ref 30.0–36.0)
MCV: 89.9 fL (ref 78.0–100.0)
Platelets: 221 10*3/uL (ref 150–400)
RBC: 4.67 MIL/uL (ref 4.22–5.81)
RDW: 13 % (ref 11.5–15.5)
WBC: 7.3 10*3/uL (ref 4.0–10.5)

## 2018-07-30 LAB — TYPE AND SCREEN
ABO/RH(D): O POS
ANTIBODY SCREEN: NEGATIVE
UNIT DIVISION: 0
Unit division: 0

## 2018-07-30 LAB — PREPARE FRESH FROZEN PLASMA
UNIT DIVISION: 0
Unit division: 0

## 2018-07-30 LAB — BPAM FFP
Blood Product Expiration Date: 201908232359
Blood Product Expiration Date: 201908232359
ISSUE DATE / TIME: 201908071814
ISSUE DATE / TIME: 201908071814
UNIT TYPE AND RH: 6200
Unit Type and Rh: 6200

## 2018-07-30 LAB — COMPREHENSIVE METABOLIC PANEL
ALBUMIN: 4.2 g/dL (ref 3.5–5.0)
ALT: 18 U/L (ref 0–44)
ANION GAP: 11 (ref 5–15)
AST: 23 U/L (ref 15–41)
Alkaline Phosphatase: 73 U/L (ref 38–126)
BILIRUBIN TOTAL: 0.8 mg/dL (ref 0.3–1.2)
BUN: 8 mg/dL (ref 6–20)
CO2: 25 mmol/L (ref 22–32)
Calcium: 9.1 mg/dL (ref 8.9–10.3)
Chloride: 103 mmol/L (ref 98–111)
Creatinine, Ser: 1.04 mg/dL (ref 0.61–1.24)
GFR calc non Af Amer: >60 mL/min (ref >60)
GLUCOSE: 98 mg/dL (ref 70–99)
POTASSIUM: 3.4 mmol/L — AB (ref 3.5–5.1)
Sodium: 139 mmol/L (ref 135–145)
TOTAL PROTEIN: 6.8 g/dL (ref 6.5–8.1)

## 2018-07-30 LAB — BPAM RBC
Blood Product Expiration Date: 201909022359
Blood Product Expiration Date: 201909062359
ISSUE DATE / TIME: 201908071813
ISSUE DATE / TIME: 201908071813
UNIT TYPE AND RH: 9500
Unit Type and Rh: 9500

## 2018-07-30 LAB — PROTIME-INR
INR: 1.1
PROTHROMBIN TIME: 14.1 s (ref 11.4–15.2)

## 2018-07-30 LAB — ETHANOL: Alcohol, Ethyl (B): <10 mg/dL (ref <10)

## 2018-07-30 LAB — ABO/RH: ABO/RH(D): O POS

## 2018-07-30 LAB — I-STAT CG4 LACTIC ACID, ED: LACTIC ACID, VENOUS: 2.7 mmol/L — AB (ref 0.5–1.9)

## 2018-07-30 MED ORDER — FENTANYL CITRATE (PF) 100 MCG/2ML IJ SOLN
INTRAMUSCULAR | Status: AC
  Filled 2018-07-30: qty 2

## 2018-07-30 MED ORDER — IOHEXOL 300 MG/ML  SOLN
100.0000 mL | Freq: Once | INTRAMUSCULAR | Status: AC | PRN
  Administered 2018-07-30: 100 mL via INTRAVENOUS

## 2018-07-30 MED ORDER — TETANUS-DIPHTH-ACELL PERTUSSIS 5-2.5-18.5 LF-MCG/0.5 IM SUSP
INTRAMUSCULAR | Status: AC
  Administered 2018-07-30: 0.5 mL via INTRAMUSCULAR
  Filled 2018-07-30: qty 0.5

## 2018-07-30 MED ORDER — CEFAZOLIN SODIUM-DEXTROSE 2-4 GM/100ML-% IV SOLN
INTRAVENOUS | Status: AC
  Administered 2018-07-30: 2000 mg
  Filled 2018-07-30: qty 100

## 2018-07-30 MED ORDER — CEPHALEXIN 500 MG PO CAPS: 500.0000 mg | ORAL_CAPSULE | Freq: Four times a day (QID) | ORAL | 0 refills | Status: AC

## 2018-07-30 MED ORDER — FENTANYL CITRATE (PF) 100 MCG/2ML IJ SOLN
50.0000 ug | Freq: Once | INTRAMUSCULAR | Status: AC
  Administered 2018-07-30: 50 ug via INTRAVENOUS
  Filled 2018-07-30: qty 2

## 2018-07-30 MED ORDER — HYDROCODONE-ACETAMINOPHEN 5-325 MG PO TABS: 1.0000 | ORAL_TABLET | Freq: Four times a day (QID) | ORAL | 0 refills | Status: AC | PRN

## 2018-07-30 NOTE — Progress Notes (Signed)
Retrieved grandmother from waiting room and helped get her a security badge; asked a resident to speak with her in consult A; he agreed; had to leave to respond to a code blue - let the nurse and grandmother know
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
## 2018-07-30 NOTE — ED Notes (Signed)
Pt's grandmother at bedside.

## 2018-07-30 NOTE — ED Notes (Signed)
Pt able to ambulate without assistance.  
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
## 2018-07-30 NOTE — Progress Notes (Signed)
Orthopedic Tech Progress Note Patient Details:  Jose Waters 07/29/1996 [id number]  Ortho Devices Type of Ortho Device: Crutches Ortho Device/Splint Interventions: Application, Adjustment   Post Interventions Patient Tolerated: Well Instructions Provided: Care of device, Adjustment of device   [name], Chayim Bialas J 07/30/2018, 8:02 PM
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
## 2018-07-30 NOTE — Progress Notes (Signed)
Orthopedic Tech Progress Note Patient Details:  Danielle Rankinllazae S xxxFryar 05/21/1996 [id number]  Patient ID: Danielle RankinAllazae S xxxFryar, male   DOB: 09/03/1996, 22 y.o.   MRN: [id number]   Nikki DomCrawford, Imari Reen 07/30/2018, 6:23 PM Made level 1 trauma visit
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
## 2018-07-30 NOTE — Discharge Instructions (Signed)
Jose Waters:  Thank you for allowing us to take care of you today.  We hope you begin feeling better soon.  To-Do: Please follow-up with the trauma team.  Call the number attached to schedule an appointment Take your antibiotic as prescribed Take tylenol and ibuprofen for pain.  You can use norco for breakthrough pain. Please return to the Emergency Department or call 911 if you experience chest pain, shortness of breath, severe pain, severe fever, altered mental status, or have any reason to think that you need emergency medical care.  Thank you again.  Hope you feel better soon.

## 2018-07-31 ENCOUNTER — Encounter (HOSPITAL_COMMUNITY): Payer: Self-pay | Admitting: *Deleted

## 2018-07-31 LAB — CDS SEROLOGY
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
## 2019-02-08 ENCOUNTER — Encounter (HOSPITAL_COMMUNITY): Payer: Self-pay | Admitting: Emergency Medicine

## 2019-02-08 ENCOUNTER — Ambulatory Visit (HOSPITAL_COMMUNITY)
Admission: EM | Admit: 2019-02-08 | Discharge: 2019-02-08 | Disposition: A | Discharge status: Home / Self Care | Payer: Medicaid Other | Attending: Family Medicine | Admitting: Family Medicine

## 2019-02-08 DIAGNOSIS — B9789 Other viral agents as the cause of diseases classified elsewhere: Secondary | ICD-10-CM

## 2019-02-08 DIAGNOSIS — J069 Acute upper respiratory infection, unspecified: Secondary | ICD-10-CM

## 2019-02-08 MED ORDER — CETIRIZINE-PSEUDOEPHEDRINE ER 5-120 MG PO TB12: 1.0000 | ORAL_TABLET | Freq: Every day | ORAL | 0 refills | Status: DC

## 2019-02-08 MED ORDER — BENZONATATE 100 MG PO CAPS: 100.0000 mg | ORAL_CAPSULE | Freq: Three times a day (TID) | ORAL | 0 refills | Status: DC

## 2019-02-08 NOTE — ED Triage Notes (Signed)
Pt c/o cough for several days, states when he takes a shower it helps break it up. States he cant eat a lot or it upsets his stomach.

## 2019-02-08 NOTE — ED Provider Notes (Signed)
[facility]   [id number] 02/08/19 Arrival Time: 1027   CC: URI symptoms   SUBJECTIVE: History from: patient.  Arnell JEDIDIAH PALANGE is a 23 y.o. male who presents with abrupt onset of PND, sore throat, productive cough, nausea, and subjective fever x 3 day.  Admits to sick exposure to girlfriend with similar symptoms.  Has tried mucinex with relief.  Denies sinus pain, rhinorrhea, SOB, wheezing, chest pain, changes in bowel or bladder habits.   Received flu shot this year: yes.  ROS: As per HPI.  History reviewed. No pertinent past medical history. Past Surgical History:  Procedure Laterality Date  . FRACTURE SURGERY     Right knuckle surgery   No Known Allergies No current facility-administered medications on file prior to encounter.    No current outpatient medications on file prior to encounter.   Social History   Socioeconomic History  . Marital status: Single    Spouse name: Not on file  . Number of children: Not on file  . Years of education: Not on file  . Highest education level: Not on file  Occupational History  . Not on file  Social Needs  . Financial resource strain: Not on file  . Food insecurity:    Worry: Not on file    Inability: Not on file  . Transportation needs:    Medical: Not on file    Non-medical: Not on file  Tobacco Use  . Smoking status: Never Smoker  Substance and Sexual Activity  . Alcohol use: No    Alcohol/week: 0.0 standard drinks  . Drug use: Yes    Types: Marijuana  . Sexual activity: Not on file  Lifestyle  . Physical activity:    Days per week: Not on file    Minutes per session: Not on file  . Stress: Not on file  Relationships  . Social connections:    Talks on phone: Not on file    Gets together: Not on file    Attends religious service: Not on file    Active member of club or organization: Not on file    Attends meetings of clubs or organizations: Not on file    Relationship status: Not on file  . Intimate  partner violence:    Fear of current or ex partner: Not on file    Emotionally abused: Not on file    Physically abused: Not on file    Forced sexual activity: Not on file  Other Topics Concern  . Not on file  Social History Narrative   ** Merged History Encounter **       Family History  Problem Relation Age of Onset  . Hypertension Father   . Diabetes Paternal Grandmother   . Hypertension Paternal Grandmother   . Unexplained death Neg Hx   . Sudden Cardiac Death Neg Hx     OBJECTIVE:  Vitals:   02/08/19 1122 02/08/19 1123  BP:  (!) 139/57  Pulse: 68   Resp: 18   Temp: 98.5 F (36.9 C)   SpO2: 100%      General appearance: alert; well-appearing, speaking in full sentences and tolerating own secretions HEENT: NCAT; Ears: EACs clear, TMs pearly gray; Eyes: PERRL.  EOM grossly intact. Nose: nares patent without rhinorrhea, mild turbinate swelling, Throat: oropharynx clear, tonsils non erythematous or enlarged, uvula midline  Neck: supple without LAD Lungs: unlabored respirations, symmetrical air entry; cough: absent; no respiratory distress; CTAB Heart: regular rate and rhythm.  Radial pulses 2+  symmetrical bilaterally Skin: warm and dry Psychological: alert and cooperative; normal mood and affect  ASSESSMENT & PLAN:  1. Viral URI with cough     Meds ordered this encounter  Medications  . cetirizine-pseudoephedrine (ZYRTEC-D) 5-120 MG tablet    Sig: Take 1 tablet by mouth daily.    Dispense:  30 tablet    Refill:  0    Order Specific Question:   Supervising Provider    Answer:   Eustace Moore [[id number]]  . benzonatate (TESSALON) 100 MG capsule    Sig: Take 1 capsule (100 mg total) by mouth every 8 (eight) hours.    Dispense:  21 capsule    Refill:  0    Order Specific Question:   Supervising Provider    Answer:   Eustace Moore [[id number]]    Get plenty of rest and push fluids Tessalon Perles prescribed for cough Zyrtec-D prescribed for nasal  congestion, runny nose, and/or sore throat Use medications daily for symptom relief Use OTC medications like ibuprofen or tylenol as needed fever or pain Follow up with PCP or Community health if symptoms persist Return or go to ER if you have any new or worsening symptoms fever, chills, nausea, vomiting, chest pain, cough, shortness of breath, wheezing, abdominal pain, changes in bowel or bladder habits, etc...  Reviewed expectations re: course of current medical issues. Questions answered. Outlined signs and symptoms indicating need for more acute intervention. Patient verbalized understanding. After Visit Summary given.         Rennis Harding, PA-C 02/08/19 1244

## 2019-02-08 NOTE — Discharge Instructions (Signed)
Get plenty of rest and push fluids Tessalon Perles prescribed for cough Zyrtec-D prescribed for nasal congestion, runny nose, and/or sore throat Use medications daily for symptom relief Use OTC medications like ibuprofen or tylenol as needed fever or pain Follow up with PCP or Community health if symptoms persist Return or go to ER if you have any new or worsening symptoms fever, chills, nausea, vomiting, chest pain, cough, shortness of breath, wheezing, abdominal pain, changes in bowel or bladder habits, etc..Marland Kitchen

## 2019-06-16 ENCOUNTER — Ambulatory Visit (HOSPITAL_COMMUNITY)
Admission: EM | Admit: 2019-06-16 | Discharge: 2019-06-16 | Disposition: A | Discharge status: Home / Self Care | Payer: Medicaid Other | Attending: Family Medicine | Admitting: Family Medicine

## 2019-06-16 ENCOUNTER — Other Ambulatory Visit: Payer: Self-pay

## 2019-06-16 ENCOUNTER — Encounter (HOSPITAL_COMMUNITY): Payer: Self-pay

## 2019-06-16 DIAGNOSIS — R300 Dysuria: Secondary | ICD-10-CM

## 2019-06-16 DIAGNOSIS — R369 Urethral discharge, unspecified: Secondary | ICD-10-CM | POA: Insufficient documentation

## 2019-06-16 DIAGNOSIS — Z202 Contact with and (suspected) exposure to infections with a predominantly sexual mode of transmission: Secondary | ICD-10-CM | POA: Insufficient documentation

## 2019-06-16 MED ORDER — CEFTRIAXONE SODIUM 250 MG IJ SOLR
250.0000 mg | Freq: Once | INTRAMUSCULAR | Status: AC
  Administered 2019-06-16: 250 mg via INTRAMUSCULAR

## 2019-06-16 MED ORDER — AZITHROMYCIN 250 MG PO TABS
1000.0000 mg | ORAL_TABLET | Freq: Once | ORAL | Status: AC
  Administered 2019-06-16: 1000 mg via ORAL

## 2019-06-16 MED ORDER — CEFTRIAXONE SODIUM 250 MG IJ SOLR
INTRAMUSCULAR | Status: AC
  Filled 2019-06-16: qty 250

## 2019-06-16 MED ORDER — AZITHROMYCIN 250 MG PO TABS
ORAL_TABLET | ORAL | Status: AC
  Filled 2019-06-16: qty 1

## 2019-06-16 NOTE — ED Triage Notes (Signed)
Pt states it burns when he voids. Pt states this started after he had sex with his partner.

## 2019-06-16 NOTE — Discharge Instructions (Addendum)
Avoid sex for a week to make sure all symptoms are clear and you no longer harbor the bacteria causing these symptoms.

## 2019-06-16 NOTE — ED Provider Notes (Signed)
[place]    CSN: [id number] Arrival date & time: 06/16/19  1611     History   Chief Complaint Chief Complaint  Patient presents with  . SEXUALLY TRANSMITTED DISEASE    HPI Jose Waters is a 23 y.o. male.   Established [facility] patient, 23 yo man.  Pt states it burns when he voids. Pt states this started after he had sex with his partner.  The encounter was Saturday and the symptoms began Sunday.  It was his first encounter with this person     History reviewed. No pertinent past medical history.  Patient Active Problem List   Diagnosis Date Noted  . PVC's (premature ventricular contractions) 09/05/2016  . Pain in the chest 09/05/2016    Past Surgical History:  Procedure Laterality Date  . FRACTURE SURGERY     Right knuckle surgery       Home Medications    Prior to Admission medications   Medication Sig Start Date End Date Taking? Authorizing Provider  benzonatate (TESSALON) 100 MG capsule Take 1 capsule (100 mg total) by mouth every 8 (eight) hours. 02/08/19   Wurst, [place], PA-C  cetirizine-pseudoephedrine (ZYRTEC-D) 5-120 MG tablet Take 1 tablet by mouth daily. 02/08/19   Lestine Box, PA-C    Family History Family History  Problem Relation Age of Onset  . Hypertension Father   . Diabetes Paternal Grandmother   . Hypertension Paternal Grandmother   . Unexplained death Neg Hx   . Sudden Cardiac Death Neg Hx     Social History Social History   Tobacco Use  . Smoking status: Never Smoker  . Smokeless tobacco: Never Used  Substance Use Topics  . Alcohol use: No    Alcohol/week: 0.0 standard drinks  . Drug use: Yes    Types: Marijuana     Allergies   Patient has no known allergies.   Review of Systems Review of Systems  Genitourinary: Positive for discharge, dysuria and penile pain.  All other systems reviewed and are negative.    Physical Exam Triage Vital Signs ED Triage Vitals  Enc Vitals Group     BP 06/16/19  1647 122/75     Pulse Rate 06/16/19 1647 78     Resp 06/16/19 1647 16     Temp 06/16/19 1647 98.8 F (37.1 C)     Temp src --      SpO2 06/16/19 1647 100 %     Weight 06/16/19 1644 160 lb (72.6 kg)     Height --      Head Circumference --      Peak Flow --      Pain Score 06/16/19 1644 2     Pain Loc --      Pain Edu? --      Excl. in [place]? --    No data found.  Updated Vital Signs BP 122/75 (BP Location: Right Arm)   Pulse 78   Temp 98.8 F (37.1 C)   Resp 16   Wt 72.6 kg   SpO2 100%   BMI 22.96 kg/m    Physical Exam Vitals signs and nursing note reviewed.  Constitutional:      General: He is not in acute distress.    Appearance: Normal appearance. He is normal weight.  HENT:     Head: Normocephalic.  Eyes:     Conjunctiva/sclera: Conjunctivae normal.  Neck:     Musculoskeletal: Normal range of motion and neck supple.  Pulmonary:  Effort: Pulmonary effort is normal.  Musculoskeletal: Normal range of motion.  Skin:    General: Skin is warm.  Neurological:     General: No focal deficit present.     Mental Status: He is alert.  Psychiatric:        Mood and Affect: Mood normal.        Thought Content: Thought content normal.      UC Treatments / Results  Labs (all labs ordered are listed, but only abnormal results are displayed) Labs Reviewed  URINE CYTOLOGY ANCILLARY ONLY    EKG None  Radiology No results found.  Procedures Procedures (including critical care time)  Medications Ordered in UC Medications  azithromycin (ZITHROMAX) tablet 1,000 mg (has no administration in time range)  cefTRIAXone (ROCEPHIN) injection 250 mg (has no administration in time range)    Initial Impression / Assessment and Plan / UC Course  I have reviewed the triage vital signs and the nursing notes.  Pertinent labs & imaging results that were available during my care of the patient were reviewed by me and considered in my medical decision making (see chart for  details).    Final Clinical Impressions(s) / UC Diagnoses   Final diagnoses:  STD exposure  Penile discharge     Discharge Instructions     Avoid sex for a week to make sure all symptoms are clear and you no longer harbor the bacteria causing these symptoms.    ED Prescriptions    None     Controlled Substance Prescriptions [place] Controlled Substance Registry consulted? Not Applicable   Elvina SidleLauenstein, Uriyah Massimo, MD 06/16/19 [id number]

## 2019-06-17 LAB — URINE CYTOLOGY ANCILLARY ONLY
Chlamydia: POSITIVE — AB
Neisseria Gonorrhea: POSITIVE — AB
Trichomonas: NEGATIVE

## 2019-06-19 ENCOUNTER — Telehealth (HOSPITAL_COMMUNITY): Payer: Self-pay | Admitting: Emergency Medicine

## 2019-06-19 NOTE — Telephone Encounter (Signed)
Chlamydia is positive.  This was treated at the urgent care visit with po zithromax 1g.  Pt needs education to please refrain from sexual intercourse for 7 days to give the medicine time to work.  Sexual partners need to be notified and tested/treated.  Condoms may reduce risk of reinfection.  Recheck or followup with PCP for further evaluation if symptoms are not improving.  [facility] notified.  Test for gonorrhea was positive. This was treated at the urgent care visit with IM rocephin 250mg and po zithromax 1g. Pt needs education to refrain from sexual intercourse for 7 days after treatment to give the medicine time to work. Sexual partners need to be notified and tested/treated. Condoms may reduce risk of reinfection. Recheck or followup with PCP for further evaluation if symptoms are not improving. [facility] notified.   Attempted to reach patient. No answer at this time. Voicemail left.    

## 2019-06-22 ENCOUNTER — Telehealth (HOSPITAL_COMMUNITY): Payer: Self-pay | Admitting: Emergency Medicine

## 2019-06-22 NOTE — Telephone Encounter (Signed)
Attempted to reach patient x2. No answer at this time. Voicemail left.    

## 2019-06-24 ENCOUNTER — Telehealth (HOSPITAL_COMMUNITY): Payer: Self-pay | Admitting: Emergency Medicine

## 2019-06-24 NOTE — Telephone Encounter (Signed)
Patient contacted and made aware of all results, all questions answered.   

## 2019-09-19 ENCOUNTER — Ambulatory Visit (HOSPITAL_COMMUNITY)
Admission: EM | Admit: 2019-09-19 | Discharge: 2019-09-19 | Disposition: A | Discharge status: Home / Self Care | Payer: Medicaid Other | Attending: Internal Medicine | Admitting: Internal Medicine

## 2019-09-19 ENCOUNTER — Encounter (HOSPITAL_COMMUNITY): Payer: Self-pay

## 2019-09-19 DIAGNOSIS — Z202 Contact with and (suspected) exposure to infections with a predominantly sexual mode of transmission: Secondary | ICD-10-CM

## 2019-09-19 MED ORDER — CEFTRIAXONE SODIUM 250 MG IJ SOLR
INTRAMUSCULAR | Status: AC
  Filled 2019-09-19: qty 250

## 2019-09-19 MED ORDER — AZITHROMYCIN 250 MG PO TABS
ORAL_TABLET | ORAL | Status: AC
  Filled 2019-09-19: qty 4

## 2019-09-19 MED ORDER — LIDOCAINE HCL (PF) 1 % IJ SOLN
INTRAMUSCULAR | Status: AC
  Filled 2019-09-19: qty 2

## 2019-09-19 MED ORDER — AZITHROMYCIN 250 MG PO TABS
1000.0000 mg | ORAL_TABLET | Freq: Once | ORAL | Status: AC
  Administered 2019-09-19: 1000 mg via ORAL

## 2019-09-19 MED ORDER — CEFTRIAXONE SODIUM 250 MG IJ SOLR
250.0000 mg | Freq: Once | INTRAMUSCULAR | Status: AC
  Administered 2019-09-19: 250 mg via INTRAMUSCULAR

## 2019-09-19 NOTE — ED Provider Notes (Signed)
[place]    CSN: [id number] Arrival date & time: 09/19/19  1349      History   Chief Complaint Chief Complaint  Patient presents with  . Exposure to STD    HPI Jose Waters is a 23 y.o. male with no past medical history comes to urgent care after his girlfriend was treated for STD.  The actual diagnosis for his sexual partner is not known.  He is sexually active with this partner.  He engages in unprotected sex.  Currently he denies any dysuria, urgency or frequency.  No scrotal testicular or groin pain or swelling.  No ulcerations on his penis.Marland Kitchen   HPI  History reviewed. No pertinent past medical history.  Patient Active Problem List   Diagnosis Date Noted  . PVC's (premature ventricular contractions) 09/05/2016  . Pain in the chest 09/05/2016    Past Surgical History:  Procedure Laterality Date  . FRACTURE SURGERY     Right knuckle surgery       Home Medications    Prior to Admission medications   Medication Sig Start Date End Date Taking? Authorizing Provider  benzonatate (TESSALON) 100 MG capsule Take 1 capsule (100 mg total) by mouth every 8 (eight) hours. 02/08/19   Wurst, [place], PA-C  cetirizine-pseudoephedrine (ZYRTEC-D) 5-120 MG tablet Take 1 tablet by mouth daily. 02/08/19   Lestine Box, PA-C    Family History Family History  Problem Relation Age of Onset  . Hypertension Father   . Diabetes Paternal Grandmother   . Hypertension Paternal Grandmother   . Unexplained death Neg Hx   . Sudden Cardiac Death Neg Hx     Social History Social History   Tobacco Use  . Smoking status: Never Smoker  . Smokeless tobacco: Never Used  Substance Use Topics  . Alcohol use: No    Alcohol/week: 0.0 standard drinks  . Drug use: Yes    Types: Marijuana     Allergies   Patient has no known allergies.   Review of Systems Review of Systems  Constitutional: Negative.   Gastrointestinal: Negative.   Genitourinary: Negative for  decreased urine volume, discharge, dysuria, genital sores, hematuria, penile pain, penile swelling, scrotal swelling and testicular pain.  Musculoskeletal: Negative.  Negative for arthralgias.     Physical Exam Triage Vital Signs ED Triage Vitals  Enc Vitals Group     BP 09/19/19 1449 122/74     Pulse Rate 09/19/19 1449 73     Resp 09/19/19 1449 16     Temp 09/19/19 1449 99.1 F (37.3 C)     Temp Source 09/19/19 1449 Oral     SpO2 09/19/19 1449 100 %     Weight --      Height --      Head Circumference --      Peak Flow --      Pain Score 09/19/19 1446 0     Pain Loc --      Pain Edu? --      Excl. in [place]? --    No data found.  Updated Vital Signs BP 122/74 (BP Location: Left Arm)   Pulse 73   Temp 99.1 F (37.3 C) (Oral)   Resp 16   SpO2 100%   Visual Acuity Right Eye Distance:   Left Eye Distance:   Bilateral Distance:    Right Eye Near:   Left Eye Near:    Bilateral Near:     Physical Exam Vitals signs and nursing note  reviewed.  Constitutional:      General: He is not in acute distress.    Appearance: He is not ill-appearing or toxic-appearing.  Cardiovascular:     Rate and Rhythm: Normal rate and regular rhythm.  Genitourinary:    Penis: Normal.      Scrotum/Testes: Normal.     Rectum: Guaiac result negative.  Musculoskeletal: Normal range of motion.  Skin:    Capillary Refill: Capillary refill takes less than 2 seconds.  Neurological:     General: No focal deficit present.     Mental Status: He is alert and oriented to person, place, and time.      UC Treatments / Results  Labs (all labs ordered are listed, but only abnormal results are displayed) Labs Reviewed - No data to display  EKG   Radiology No results found.  Procedures Procedures (including critical care time)  Medications Ordered in UC Medications - No data to display  Initial Impression / Assessment and Plan / UC Course  I have reviewed the triage vital signs and the  nursing notes.  Pertinent labs & imaging results that were available during my care of the patient were reviewed by me and considered in my medical decision making (see chart for details).     1.  Exposure to STD: Urethral swab for GC/chlamydia/trichomonas Azithromycin 1 g Ceftriaxone 250 mg IM x1 dose Patient is advised to abstain from sexual intercourse for 7 days.   If the test results require further management, patient will be called and appropriate treatment given.  final Clinical Impressions(s) / UC Diagnoses   Final diagnoses:  None   Discharge Instructions   None    ED Prescriptions    None     PDMP not reviewed this encounter.   Merrilee Jansky, MD 09/19/19 [phone]

## 2019-09-19 NOTE — ED Triage Notes (Signed)
Patient report his sexual partner asked him to be tested for STD, as she is being treat it for STD. Patient denies any sign and symptoms.

## 2019-09-22 LAB — CYTOLOGY, (ORAL, ANAL, URETHRAL) ANCILLARY ONLY
Chlamydia: NEGATIVE
Neisseria Gonorrhea: NEGATIVE
Trichomonas: POSITIVE — AB

## 2019-09-24 ENCOUNTER — Telehealth (HOSPITAL_COMMUNITY): Payer: Self-pay | Admitting: Emergency Medicine

## 2019-09-24 MED ORDER — METRONIDAZOLE 500 MG PO TABS: 2000.0000 mg | ORAL_TABLET | Freq: Once | ORAL | 0 refills | Status: AC

## 2019-09-24 NOTE — Telephone Encounter (Signed)
Trichomonas is positive. Rx  for Flagyl 2 grams, once was sent to the pharmacy of record. Pt needs education to refrain from sexual intercourse for 7 days to give the medicine time to work. Sexual partners need to be notified and tested/treated. Condoms may reduce risk of reinfection. Recheck for further evaluation if symptoms are not improving.   Patient contacted and made aware of    results, all questions answered    

## 2019-11-27 ENCOUNTER — Ambulatory Visit (HOSPITAL_COMMUNITY)
Admission: EM | Admit: 2019-11-27 | Discharge: 2019-11-27 | Disposition: A | Discharge status: Home / Self Care | Payer: Self-pay | Attending: Family Medicine | Admitting: Family Medicine

## 2019-11-27 ENCOUNTER — Encounter (HOSPITAL_COMMUNITY): Payer: Self-pay

## 2019-11-27 ENCOUNTER — Other Ambulatory Visit: Payer: Self-pay

## 2019-11-27 DIAGNOSIS — Z7251 High risk heterosexual behavior: Secondary | ICD-10-CM

## 2019-11-27 DIAGNOSIS — Z113 Encounter for screening for infections with a predominantly sexual mode of transmission: Secondary | ICD-10-CM

## 2019-11-27 NOTE — Discharge Instructions (Signed)
Will notify you of any positive findings and if any changes to treatment are needed.   °You may monitor your results on your MyChart online as well.   °Please use condoms to prevent STDs.  °

## 2019-11-27 NOTE — ED Provider Notes (Signed)
[facility]    CSN: [id number] Arrival date & time: 11/27/19  1644      History   Chief Complaint Chief Complaint  Patient presents with  . std test    HPI Jose Waters is a 23 y.o. male.   Jose Waters presents with requests for std screening. Denies any current complaints but states that he had unprotected intercourse two days ago. No specific known exposure. Has had std's in the past. Has had two partners in the past few months. No pelvic pain, back pain, sores, lesions, redness or swelling of genitalia.    ROS per HPI, negative if not otherwise mentioned.      History reviewed. No pertinent past medical history.  Patient Active Problem List   Diagnosis Date Noted  . PVC's (premature ventricular contractions) 09/05/2016  . Pain in the chest 09/05/2016    Past Surgical History:  Procedure Laterality Date  . FRACTURE SURGERY     Right knuckle surgery       Home Medications    Prior to Admission medications   Medication Sig Start Date End Date Taking? Authorizing Provider  benzonatate (TESSALON) 100 MG capsule Take 1 capsule (100 mg total) by mouth every 8 (eight) hours. 02/08/19   Wurst, [place], PA-C  cetirizine-pseudoephedrine (ZYRTEC-D) 5-120 MG tablet Take 1 tablet by mouth daily. 02/08/19   Rennis Harding, PA-C    Family History Family History  Problem Relation Age of Onset  . Hypertension Father   . Diabetes Paternal Grandmother   . Hypertension Paternal Grandmother   . Unexplained death Neg Hx   . Sudden Cardiac Death Neg Hx     Social History Social History   Tobacco Use  . Smoking status: Never Smoker  . Smokeless tobacco: Never Used  Substance Use Topics  . Alcohol use: No    Alcohol/week: 0.0 standard drinks  . Drug use: Yes    Types: Marijuana     Allergies   Patient has no known allergies.   Review of Systems Review of Systems   Physical Exam Triage Vital Signs ED Triage Vitals  Enc Vitals Group    BP 11/27/19 1800 137/81     Pulse Rate 11/27/19 1800 84     Resp 11/27/19 1800 15     Temp 11/27/19 1800 99.4 F (37.4 C)     Temp Source 11/27/19 1800 Oral     SpO2 11/27/19 1800 98 %     Weight --      Height --      Head Circumference --      Peak Flow --      Pain Score 11/27/19 1758 0     Pain Loc --      Pain Edu? --      Excl. in [place]? --    No data found.  Updated Vital Signs BP 137/81 (BP Location: Left Arm)   Pulse 84   Temp 99.4 F (37.4 C) (Oral)   Resp 15   SpO2 98%    Physical Exam Constitutional:      Appearance: He is well-developed.  Cardiovascular:     Rate and Rhythm: Normal rate.  Pulmonary:     Effort: Pulmonary effort is normal.  Abdominal:     Palpations: Abdomen is soft. Abdomen is not rigid.     Tenderness: There is no abdominal tenderness. There is no guarding or rebound. Negative signs include Murphy's sign and McBurney's sign.     Comments: Denies  scrotal redness, swelling, pain; denies sores or lesions; gu exam deferred   Skin:    General: Skin is warm and dry.  Neurological:     Mental Status: He is alert and oriented to person, place, and time.      UC Treatments / Results  Labs (all labs ordered are listed, but only abnormal results are displayed) Labs Reviewed  [place]    EKG   Radiology No results found.  Procedures Procedures (including critical care time)  Medications Ordered in UC Medications - No data to display  Initial Impression / Assessment and Plan / UC Course  I have reviewed the triage vital signs and the nursing notes.  Pertinent labs & imaging results that were available during my care of the patient were reviewed by me and considered in my medical decision making (see chart for details).     Penile cytology collected and pending. Will notify of any positive findings and if any changes to treatment are needed.  Safe sex practices encouraged. Patient verbalized understanding and  agreeable to plan.   Final Clinical Impressions(s) / UC Diagnoses   Final diagnoses:  Screening for STD (sexually transmitted disease)  Unprotected sexual intercourse     Discharge Instructions     Will notify you of any positive findings and if any changes to treatment are needed.   You may monitor your results on your MyChart online as well.   Please use condoms to prevent STD's.       ED Prescriptions    None     PDMP not reviewed this encounter.   Zigmund Gottron, NP 11/27/19 1839

## 2019-11-27 NOTE — ED Triage Notes (Signed)
Pt presents for STD test. Pt denies any signa and symptoms.

## 2019-12-02 ENCOUNTER — Telehealth (HOSPITAL_COMMUNITY): Payer: Self-pay | Admitting: Emergency Medicine

## 2019-12-02 LAB — URINE CYTOLOGY ANCILLARY ONLY
Bacterial vaginitis: NEGATIVE
Candida vaginitis: NEGATIVE
Chlamydia: POSITIVE — AB
Neisseria Gonorrhea: NEGATIVE
Trichomonas: NEGATIVE

## 2019-12-02 MED ORDER — AZITHROMYCIN 250 MG PO TABS: 1000.0000 mg | ORAL_TABLET | Freq: Once | ORAL | 0 refills | Status: AC

## 2019-12-02 NOTE — Telephone Encounter (Signed)
Chlamydia is positive.  Rx po zithromax 1g #1 dose no refills was sent to the pharmacy of record.  Pt needs education to please refrain from sexual intercourse for 7 days to give the medicine time to work, sexual partners need to be notified and tested/treated.  Condoms may reduce risk of reinfection.  Recheck or followup with PCP for further evaluation if symptoms are not improving.   [facility] notified.  Patient contacted by phone and made aware of    results. Pt verbalized understanding and had all questions answered.    

## 2020-01-19 ENCOUNTER — Encounter (HOSPITAL_COMMUNITY): Payer: Self-pay

## 2020-01-19 ENCOUNTER — Ambulatory Visit (HOSPITAL_COMMUNITY)
Admission: EM | Admit: 2020-01-19 | Discharge: 2020-01-19 | Disposition: A | Discharge status: Home / Self Care | Payer: Medicaid Other | Attending: Family Medicine | Admitting: Family Medicine

## 2020-01-19 ENCOUNTER — Other Ambulatory Visit: Payer: Self-pay

## 2020-01-19 DIAGNOSIS — Z202 Contact with and (suspected) exposure to infections with a predominantly sexual mode of transmission: Secondary | ICD-10-CM

## 2020-01-19 DIAGNOSIS — R369 Urethral discharge, unspecified: Secondary | ICD-10-CM | POA: Insufficient documentation

## 2020-01-19 DIAGNOSIS — Z7251 High risk heterosexual behavior: Secondary | ICD-10-CM

## 2020-01-19 LAB — POCT URINALYSIS DIP (DEVICE)
Glucose, UA: NEGATIVE mg/dL
Nitrite: NEGATIVE
Protein, ur: 30 mg/dL — AB
Specific Gravity, Urine: 1.025 (ref 1.005–1.030)
Urobilinogen, UA: 1 mg/dL (ref 0.0–1.0)
pH: 7 (ref 5.0–8.0)

## 2020-01-19 MED ORDER — CEFTRIAXONE SODIUM 500 MG IJ SOLR
500.0000 mg | Freq: Once | INTRAMUSCULAR | Status: AC
  Administered 2020-01-19: 16:00:00 500 mg via INTRAMUSCULAR

## 2020-01-19 MED ORDER — CEFTRIAXONE SODIUM 500 MG IJ SOLR
INTRAMUSCULAR | Status: AC
  Filled 2020-01-19: qty 500

## 2020-01-19 MED ORDER — LIDOCAINE HCL (PF) 1 % IJ SOLN
INTRAMUSCULAR | Status: AC
  Filled 2020-01-19: qty 2

## 2020-01-19 MED ORDER — DOXYCYCLINE HYCLATE 100 MG PO CAPS: 100.0000 mg | ORAL_CAPSULE | Freq: Two times a day (BID) | ORAL | 0 refills | Status: DC

## 2020-01-19 NOTE — ED Triage Notes (Signed)
Pt presents to UC for STD testing. Pt reports white penile discharge x 5 days, burning sensation when urinating x 3 days.

## 2020-01-19 NOTE — Discharge Instructions (Addendum)

## 2020-01-19 NOTE — ED Provider Notes (Addendum)
[facility]   [id number] 01/19/20 Arrival Time: 1420  ASSESSMENT & PLAN:  1. Penile discharge       Discharge Instructions     You have been given the following today for treatment of suspected gonorrhea and/or chlamydia:  cefTRIAXone (ROCEPHIN) injection 500 mg  Please pick up your prescription for doxycycline 100 mg and begin taking twice daily for the next seven (7) days.  Even though we have treated you today, we have sent testing for sexually transmitted infections. We will notify you of any positive results once they are received. If required, we will prescribe any medications you might need.  Please refrain from all sexual activity for at least the next seven days.     Pending: Labs Reviewed  POCT URINALYSIS DIP (DEVICE) - Abnormal; Notable for the following components:      Result Value   Bilirubin Urine SMALL (*)    Ketones, ur TRACE (*)    Hgb urine dipstick TRACE (*)    Protein, ur 30 (*)    Leukocytes,Ua SMALL (*)    All other components within normal limits    Will notify of any positive results. Instructed to refrain from sexual activity for at least seven days. No HIV/RPR testing desired. Urine culture sent.   Reviewed expectations re: course of current medical issues. Questions answered. Outlined signs and symptoms indicating need for more acute intervention. Patient verbalized understanding. After Visit Summary given.   SUBJECTIVE:  Jose Waters is a 24 y.o. male who presents with complaint of penile discharge. Onset abrupt. First noticed 4-5 d ago. Describes discharge as thick and opaque. No specific aggravating or alleviating factors reported. Denies: urinary frequency and gross hematuria. Mild occasional "burning feeling" with urination the past 2-3 d. Not worsening. Afebrile. No abdominal or pelvic pain. No n/v. No rashes or lesions. Reports that he is sexually active with multiple male partners (2); without regular condom  use. OTC treatment: none. Chart review shows positive chlamydia 11/2019; treated.   OBJECTIVE:  Vitals:   01/19/20 1458  BP: 117/67  Pulse: 72  Resp: 17  Temp: 98.7 F (37.1 C)  TempSrc: Oral  SpO2: 100%     General appearance: alert, cooperative, appears stated age and no distress Throat: lips, mucosa, and tongue normal; teeth and gums normal CV: RRR Lungs: CTAB Back: no CVA tenderness; FROM at waist Abdomen: soft, non-tender GU: deferred Skin: warm and dry Psychological: alert and cooperative; normal mood and affect.  Results for orders placed or performed during the hospital encounter of 01/19/20  POCT urinalysis dip (device)  Result Value Ref Range   Glucose, UA NEGATIVE NEGATIVE mg/dL   Bilirubin Urine SMALL (A) NEGATIVE   Ketones, ur TRACE (A) NEGATIVE mg/dL   Specific Gravity, Urine 1.025 1.005 - 1.030   Hgb urine dipstick TRACE (A) NEGATIVE   pH 7.0 5.0 - 8.0   Protein, ur 30 (A) NEGATIVE mg/dL   Urobilinogen, UA 1.0 0.0 - 1.0 mg/dL   Nitrite NEGATIVE NEGATIVE   Leukocytes,Ua SMALL (A) NEGATIVE    Labs Reviewed  POCT URINALYSIS DIP (DEVICE) - Abnormal; Notable for the following components:      Result Value   Bilirubin Urine SMALL (*)    Ketones, ur TRACE (*)    Hgb urine dipstick TRACE (*)    Protein, ur 30 (*)    Leukocytes,Ua SMALL (*)    All other components within normal limits    No Known Allergies  Family History  Problem  Relation Age of Onset  . Hypertension Father   . Diabetes Paternal Grandmother   . Hypertension Paternal Grandmother   . Unexplained death Neg Hx   . Sudden Cardiac Death Neg Hx    Social History   Socioeconomic History  . Marital status: Single    Spouse name: Not on file  . Number of children: Not on file  . Years of education: Not on file  . Highest education level: Not on file  Occupational History  . Not on file  Tobacco Use  . Smoking status: Never Smoker  . Smokeless tobacco: Never Used  Substance and  Sexual Activity  . Alcohol use: No    Alcohol/week: 0.0 standard drinks  . Drug use: Yes    Types: Marijuana  . Sexual activity: Not on file  Other Topics Concern  . Not on file  Social History Narrative   ** Merged History Encounter **       Social Determinants of Health   Financial Resource Strain:   . Difficulty of Paying Living Expenses: Not on file  Food Insecurity:   . Worried About Charity fundraiser in the Last Year: Not on file  . Ran Out of Food in the Last Year: Not on file  Transportation Needs:   . Lack of Transportation (Medical): Not on file  . Lack of Transportation (Non-Medical): Not on file  Physical Activity:   . Days of Exercise per Week: Not on file  . Minutes of Exercise per Session: Not on file  Stress:   . Feeling of Stress : Not on file  Social Connections:   . Frequency of Communication with Friends and Family: Not on file  . Frequency of Social Gatherings with Friends and Family: Not on file  . Attends Religious Services: Not on file  . Active Member of Clubs or Organizations: Not on file  . Attends Archivist Meetings: Not on file  . Marital Status: Not on file  Intimate Partner Violence:   . Fear of Current or Ex-Partner: Not on file  . Emotionally Abused: Not on file  . Physically Abused: Not on file  . Sexually Abused: Not on file          Vanessa Kick, MD 01/19/20 1542    Vanessa Kick, MD 01/19/20 [phone]

## 2020-01-20 LAB — CYTOLOGY, (ORAL, ANAL, URETHRAL) ANCILLARY ONLY
Chlamydia: NEGATIVE
Neisseria Gonorrhea: POSITIVE — AB
Trichomonas: NEGATIVE

## 2020-01-20 LAB — URINE CULTURE: Culture: NO GROWTH

## 2020-01-22 ENCOUNTER — Telehealth (HOSPITAL_COMMUNITY): Payer: Self-pay | Admitting: Emergency Medicine

## 2020-01-22 NOTE — Telephone Encounter (Signed)
Test for gonorrhea was positive. This was treated at the urgent care visit with IM rocephin 500mg. Please refrain from sexual intercourse for 7 days after treatment to give the medicine time to work. Sexual partners need to be notified and tested/treated. Condoms may reduce risk of reinfection. Recheck or followup with PCP for further evaluation if symptoms are not improving. [facility] notified.   Patient contacted by phone and made aware of    results. Pt verbalized understanding and had all questions answered.    

## 2020-02-24 ENCOUNTER — Other Ambulatory Visit: Payer: Self-pay

## 2020-02-24 ENCOUNTER — Ambulatory Visit (HOSPITAL_COMMUNITY)
Admission: EM | Admit: 2020-02-24 | Discharge: 2020-02-24 | Disposition: A | Discharge status: Home / Self Care | Payer: Medicaid Other | Attending: Urgent Care | Admitting: Urgent Care

## 2020-02-24 ENCOUNTER — Encounter (HOSPITAL_COMMUNITY): Payer: Self-pay | Admitting: Emergency Medicine

## 2020-02-24 DIAGNOSIS — Z7251 High risk heterosexual behavior: Secondary | ICD-10-CM | POA: Insufficient documentation

## 2020-02-24 DIAGNOSIS — R300 Dysuria: Secondary | ICD-10-CM | POA: Insufficient documentation

## 2020-02-24 DIAGNOSIS — Z8619 Personal history of other infectious and parasitic diseases: Secondary | ICD-10-CM | POA: Insufficient documentation

## 2020-02-24 DIAGNOSIS — R369 Urethral discharge, unspecified: Secondary | ICD-10-CM | POA: Insufficient documentation

## 2020-02-24 MED ORDER — CEFTRIAXONE SODIUM 500 MG IJ SOLR
INTRAMUSCULAR | Status: AC
  Filled 2020-02-24: qty 500

## 2020-02-24 MED ORDER — CEFTRIAXONE SODIUM 500 MG IJ SOLR
500.0000 mg | Freq: Once | INTRAMUSCULAR | Status: AC
  Administered 2020-02-24: 500 mg via INTRAMUSCULAR

## 2020-02-24 NOTE — Discharge Instructions (Signed)
Avoid all forms of sexual intercourse (oral, vaginal, anal) for the next 7 days to avoid spreading/reinfecting. Return if symptoms worsen/do not resolve, you develop fever, abdominal pain, blood in your urine, or are re-exposed to an STI.  

## 2020-02-24 NOTE — ED Triage Notes (Signed)
Symptoms started on 02/22/2020  Burning and penile discharge are present per patient

## 2020-02-24 NOTE — ED Provider Notes (Signed)
[facility]   MRN: [id number] DOB: 12-05-1996  Subjective:   Jose Waters is a 24 y.o. male presenting for several day history of recurrent penile discharge, dysuria.  In January, patient was treated for gonorrhea, test results confirmed this infection.  He never ended up filling doxycycline.  He states that he continues to have sex with the same individuals and does not use condoms.  No current facility-administered medications for this encounter. No current outpatient medications on file.   No Known Allergies  History reviewed. No pertinent past medical history.   Past Surgical History:  Procedure Laterality Date  . FRACTURE SURGERY     Right knuckle surgery    Family History  Problem Relation Age of Onset  . Hypertension Father   . Diabetes Paternal Grandmother   . Hypertension Paternal Grandmother   . Unexplained death Neg Hx   . Sudden Cardiac Death Neg Hx     Social History   Tobacco Use  . Smoking status: Current Every Day Smoker  . Smokeless tobacco: Never Used  Substance Use Topics  . Alcohol use: Yes    Alcohol/week: 0.0 standard drinks  . Drug use: Not Currently    Types: Marijuana    ROS   Objective:   Vitals: BP 116/77 (BP Location: Left Arm)   Pulse 89   Temp 98.6 F (37 C) (Oral)   Resp 16   SpO2 99%   Physical Exam Constitutional:      General: He is not in acute distress.    Appearance: Normal appearance. He is well-developed and normal weight. He is not ill-appearing, toxic-appearing or diaphoretic.  HENT:     Head: Normocephalic and atraumatic.     Right Ear: External ear normal.     Left Ear: External ear normal.     Nose: Nose normal.     Mouth/Throat:     Pharynx: Oropharynx is clear.  Eyes:     General: No scleral icterus.       Right eye: No discharge.        Left eye: No discharge.     Extraocular Movements: Extraocular movements intact.     Pupils: Pupils are equal, round, and reactive to light.    Cardiovascular:     Rate and Rhythm: Normal rate.  Pulmonary:     Effort: Pulmonary effort is normal.  Genitourinary:    Penis: Circumcised. Discharge present. No phimosis, paraphimosis, hypospadias, erythema, tenderness, swelling or lesions.   Musculoskeletal:     Cervical back: Normal range of motion.  Neurological:     Mental Status: He is alert and oriented to person, place, and time.  Psychiatric:        Mood and Affect: Mood normal.        Behavior: Behavior normal.        Thought Content: Thought content normal.        Judgment: Judgment normal.      Assessment and Plan :   1. Penile discharge   2. Dysuria   3. History of gonorrhea   4. High risk heterosexual behavior     We will treat with IM ceftriaxone today.  Emphasized need for safe sex practices and being more judicious with his sex partners.  We will hold off on additional treatment pending lab results.  Counseled on safe sex practices including abstaining for 1 week following treatment.  Counseled patient on potential for adverse effects with medications prescribed/recommended today, ER and return-to-clinic precautions discussed, patient  verbalized understanding.    Jaynee Eagles, PA-C 02/24/20 1114

## 2020-02-25 LAB — CYTOLOGY, (ORAL, ANAL, URETHRAL) ANCILLARY ONLY
Chlamydia: NEGATIVE
Neisseria Gonorrhea: POSITIVE — AB
Trichomonas: NEGATIVE

## 2020-02-26 ENCOUNTER — Encounter (HOSPITAL_COMMUNITY): Payer: Self-pay

## 2020-02-26 ENCOUNTER — Telehealth (HOSPITAL_COMMUNITY): Payer: Self-pay | Admitting: Emergency Medicine

## 2020-02-26 NOTE — Telephone Encounter (Signed)
Test for gonorrhea was positive. This was treated at the urgent care visit with IM rocephin 500mg . Please refrain from sexual intercourse for 7 days after treatment to give the medicine time to work. Sexual partners need to be notified and tested/treated. Condoms may reduce risk of reinfection. Recheck or followup with PCP for further evaluation if symptoms are not improving. [facility] notified.   Attempted to reach patient. No answer at this time. Voicemail left.   If you have any questions, you may call me at [phone]

## 2020-02-29 ENCOUNTER — Telehealth (HOSPITAL_COMMUNITY): Payer: Self-pay | Admitting: Emergency Medicine

## 2020-02-29 NOTE — Telephone Encounter (Signed)
Attempted to reach patient x2. No answer at this time. Voicemail left.   Letter sent.   

## 2020-12-26 ENCOUNTER — Telehealth (HOSPITAL_COMMUNITY): Payer: Self-pay

## 2020-12-26 ENCOUNTER — Other Ambulatory Visit: Payer: Self-pay

## 2020-12-26 ENCOUNTER — Encounter (HOSPITAL_COMMUNITY): Payer: Self-pay | Admitting: Emergency Medicine

## 2020-12-26 ENCOUNTER — Ambulatory Visit (HOSPITAL_COMMUNITY)
Admission: EM | Admit: 2020-12-26 | Discharge: 2020-12-26 | Disposition: A | Discharge status: Home / Self Care | Payer: HRSA Program | Attending: Urgent Care | Admitting: Urgent Care

## 2020-12-26 DIAGNOSIS — U071 COVID-19: Secondary | ICD-10-CM | POA: Diagnosis not present

## 2020-12-26 DIAGNOSIS — R6883 Chills (without fever): Secondary | ICD-10-CM | POA: Diagnosis present

## 2020-12-26 DIAGNOSIS — Z202 Contact with and (suspected) exposure to infections with a predominantly sexual mode of transmission: Secondary | ICD-10-CM

## 2020-12-26 DIAGNOSIS — J069 Acute upper respiratory infection, unspecified: Secondary | ICD-10-CM

## 2020-12-26 DIAGNOSIS — F17200 Nicotine dependence, unspecified, uncomplicated: Secondary | ICD-10-CM | POA: Diagnosis not present

## 2020-12-26 DIAGNOSIS — Z283 Underimmunization status: Secondary | ICD-10-CM | POA: Diagnosis not present

## 2020-12-26 LAB — SARS CORONAVIRUS 2 (TAT 6-24 HRS): SARS Coronavirus 2: POSITIVE — AB

## 2020-12-26 MED ORDER — LIDOCAINE HCL (PF) 1 % IJ SOLN
INTRAMUSCULAR | Status: AC
  Filled 2020-12-26: qty 2

## 2020-12-26 MED ORDER — METRONIDAZOLE 500 MG PO TABS: 500.0000 mg | ORAL_TABLET | Freq: Two times a day (BID) | ORAL | 0 refills | Status: DC

## 2020-12-26 MED ORDER — BENZONATATE 100 MG PO CAPS: 100.0000 mg | ORAL_CAPSULE | Freq: Three times a day (TID) | ORAL | 0 refills | Status: AC | PRN

## 2020-12-26 MED ORDER — PSEUDOEPHEDRINE HCL 60 MG PO TABS: 60.0000 mg | ORAL_TABLET | Freq: Three times a day (TID) | ORAL | 0 refills | Status: AC | PRN

## 2020-12-26 MED ORDER — CEFTRIAXONE SODIUM 500 MG IJ SOLR
INTRAMUSCULAR | Status: AC
  Filled 2020-12-26: qty 500

## 2020-12-26 MED ORDER — PROMETHAZINE-DM 6.25-15 MG/5ML PO SYRP: 5.0000 mL | ORAL_SOLUTION | Freq: Every evening | ORAL | 0 refills | Status: DC | PRN

## 2020-12-26 MED ORDER — METRONIDAZOLE 500 MG PO TABS: 500.0000 mg | ORAL_TABLET | Freq: Two times a day (BID) | ORAL | 0 refills | Status: AC

## 2020-12-26 MED ORDER — PSEUDOEPHEDRINE HCL 60 MG PO TABS: 60.0000 mg | ORAL_TABLET | Freq: Three times a day (TID) | ORAL | 0 refills | Status: DC | PRN

## 2020-12-26 MED ORDER — CEFTRIAXONE SODIUM 500 MG IJ SOLR
500.0000 mg | Freq: Once | INTRAMUSCULAR | Status: AC
  Administered 2020-12-26: 500 mg via INTRAMUSCULAR

## 2020-12-26 MED ORDER — CETIRIZINE HCL 10 MG PO TABS: 10.0000 mg | ORAL_TABLET | Freq: Every day | ORAL | 0 refills | Status: AC

## 2020-12-26 MED ORDER — BENZONATATE 100 MG PO CAPS: 100.0000 mg | ORAL_CAPSULE | Freq: Three times a day (TID) | ORAL | 0 refills | Status: DC | PRN

## 2020-12-26 MED ORDER — PROMETHAZINE-DM 6.25-15 MG/5ML PO SYRP: 5.0000 mL | ORAL_SOLUTION | Freq: Every evening | ORAL | 0 refills | Status: AC | PRN

## 2020-12-26 MED ORDER — CETIRIZINE HCL 10 MG PO TABS: 10.0000 mg | ORAL_TABLET | Freq: Every day | ORAL | 0 refills | Status: DC

## 2020-12-26 NOTE — Discharge Instructions (Addendum)
Avoid all forms of sexual intercourse (oral, vaginal, anal) for the next 7 days to avoid spreading/reinfecting. Return if symptoms worsen/do not resolve, you develop fever, abdominal pain, blood in your urine, or are re-exposed to an STI.  We will notify you of your COVID-19 test results as they arrive and may take between 24 to 48 hours.  I encourage you to sign up for MyChart if you have not already done so as this can be the easiest way for [place] to communicate results to you online or through a phone app.  In the meantime, if you develop worsening symptoms including fever, chest pain, shortness of breath despite our current treatment plan then please report to the emergency room as this may be a sign of worsening status from possible COVID-19 infection.  Otherwise, we will manage this as a viral syndrome. For sore throat or cough try using a honey-based tea. Use 3 teaspoons of honey with juice squeezed from half lemon. Place shaved pieces of ginger into 1/2-1 cup of water and warm over stove top. Then mix the ingredients and repeat every 4 hours as needed. Please take Tylenol 500mg -650mg  every 6 hours for aches and pains, fevers. Hydrate very well with at least 2 liters of water. Eat light meals such as soups to replenish electrolytes and soft fruits, veggies. Start an antihistamine like Zyrtec, Allegra or Claritin for postnasal drainage, sinus congestion.  You can take this together with pseudoephedrine (Sudafed) at a dose of 60 mg 2-3 times a day as needed for the same kind of congestion.

## 2020-12-26 NOTE — ED Triage Notes (Signed)
Pt presents with STD testing after known exposure. Also c/o chills, headache, sneezing, and body aches xs 2 days.

## 2020-12-26 NOTE — ED Provider Notes (Signed)
[facility]   MRN: [id number] DOB: 1996-05-28  Subjective:   Jose Waters is a 25 y.o. male presenting for 2-day history of cute onset of chills, headache, sneezing, body aches.  Patient is not Covid vaccinated.  Denies history of lung disorders, chest pain, shortness of breath, loss of sense of taste and smell.  Patient is a smoker. He did also have exposure to gonorrhea and trichomonas.  States that one of his sex partners told him she tested positive for this. Denies dysuria, hematuria, urinary frequency, penile discharge, penile swelling, testicular pain, testicular swelling, anal pain, groin pain.   No current facility-administered medications for this encounter. No current outpatient medications on file.   No Known Allergies  History reviewed. No pertinent past medical history.   Past Surgical History:  Procedure Laterality Date  . FRACTURE SURGERY     Right knuckle surgery    Family History  Problem Relation Age of Onset  . Hypertension Father   . Diabetes Paternal Grandmother   . Hypertension Paternal Grandmother   . Unexplained death Neg Hx   . Sudden Cardiac Death Neg Hx     Social History   Tobacco Use  . Smoking status: Current Every Day Smoker  . Smokeless tobacco: Never Used  Substance Use Topics  . Alcohol use: Yes    Alcohol/week: 0.0 standard drinks  . Drug use: Not Currently    Types: Marijuana    ROS   Objective:   Vitals: BP 120/68 (BP Location: Right Arm)   Pulse 93   Temp 99.1 F (37.3 C) (Oral)   Resp 16   SpO2 100%   Physical Exam Constitutional:      General: He is not in acute distress.    Appearance: Normal appearance. He is well-developed. He is not ill-appearing, toxic-appearing or diaphoretic.  HENT:     Head: Normocephalic and atraumatic.     Right Ear: External ear normal.     Left Ear: External ear normal.     Nose: Nose normal.     Mouth/Throat:     Mouth: Mucous membranes are moist.      Pharynx: Oropharynx is clear.  Eyes:     General: No scleral icterus.    Extraocular Movements: Extraocular movements intact.     Pupils: Pupils are equal, round, and reactive to light.  Cardiovascular:     Rate and Rhythm: Normal rate and regular rhythm.     Heart sounds: Normal heart sounds. No murmur heard. No friction rub. No gallop.   Pulmonary:     Effort: Pulmonary effort is normal. No respiratory distress.     Breath sounds: Normal breath sounds. No stridor. No wheezing, rhonchi or rales.  Neurological:     Mental Status: He is alert and oriented to person, place, and time.  Psychiatric:        Mood and Affect: Mood normal.        Behavior: Behavior normal.        Thought Content: Thought content normal.     Assessment and Plan :   PDMP not reviewed this encounter.  1. Viral URI with cough   2. Exposure to STD     Will manage for viral illness such as viral URI, viral syndrome, viral rhinitis, COVID-19. Counseled patient on nature of COVID-19 including modes of transmission, diagnostic testing, management and supportive care.  Offered scripts for symptomatic relief. COVID 19 testing is pending.  Given his exposure, treated with  IM ceftriaxone in clinic.  Flagyl as an outpatient.  STI testing pending.  Counseled patient on potential for adverse effects with medications prescribed/recommended today, ER and return-to-clinic precautions discussed, patient verbalized understanding.     Wallis Bamberg, [place] 12/26/20 [phone]

## 2020-12-27 LAB — CYTOLOGY, (ORAL, ANAL, URETHRAL) ANCILLARY ONLY
Chlamydia: NEGATIVE
Comment: NEGATIVE
Comment: NEGATIVE
Comment: NORMAL
Neisseria Gonorrhea: NEGATIVE
Trichomonas: POSITIVE — AB
